# Patient Record
Sex: Male | Born: 1980 | Race: Black or African American | Hispanic: No | Marital: Single | Smoking: Current some day smoker
Health system: Southern US, Community
[De-identification: ages and names within clinical notes are randomized; demographics above are authoritative.]

## PROBLEM LIST (undated history)

## (undated) DIAGNOSIS — L729 Follicular cyst of the skin and subcutaneous tissue, unspecified: Secondary | ICD-10-CM

## (undated) HISTORY — PX: HAND TENDON SURGERY: SHX663

---
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
## 2000-02-23 ENCOUNTER — Emergency Department (HOSPITAL_COMMUNITY): Admission: EM | Admit: 2000-02-23 | Discharge: 2000-02-24 | Payer: Self-pay

## 2000-02-24 ENCOUNTER — Emergency Department (HOSPITAL_COMMUNITY): Admission: EM | Admit: 2000-02-24 | Discharge: 2000-02-24 | Payer: Self-pay

## 2000-02-26 ENCOUNTER — Emergency Department (HOSPITAL_COMMUNITY): Admission: EM | Admit: 2000-02-26 | Discharge: 2000-02-26 | Payer: Self-pay | Admitting: Emergency Medicine

## 2000-04-29 ENCOUNTER — Emergency Department (HOSPITAL_COMMUNITY): Admission: EM | Admit: 2000-04-29 | Discharge: 2000-04-29 | Payer: Self-pay | Admitting: Emergency Medicine

## 2000-05-16 ENCOUNTER — Emergency Department (HOSPITAL_COMMUNITY): Admission: EM | Admit: 2000-05-16 | Discharge: 2000-05-16 | Payer: Self-pay | Admitting: Emergency Medicine

## 2000-05-18 ENCOUNTER — Emergency Department (HOSPITAL_COMMUNITY): Admission: EM | Admit: 2000-05-18 | Discharge: 2000-05-18 | Payer: Self-pay | Admitting: Emergency Medicine

## 2000-07-14 ENCOUNTER — Emergency Department (HOSPITAL_COMMUNITY): Admission: EM | Admit: 2000-07-14 | Discharge: 2000-07-14 | Payer: Self-pay | Admitting: Emergency Medicine

## 2001-01-14 ENCOUNTER — Emergency Department (HOSPITAL_COMMUNITY): Admission: EM | Admit: 2001-01-14 | Discharge: 2001-01-14 | Payer: Self-pay | Admitting: Internal Medicine

## 2002-01-30 ENCOUNTER — Emergency Department (HOSPITAL_COMMUNITY): Admission: EM | Admit: 2002-01-30 | Discharge: 2002-01-30 | Payer: Self-pay | Admitting: Emergency Medicine

## 2002-04-15 ENCOUNTER — Encounter: Payer: Self-pay | Admitting: Emergency Medicine

## 2002-04-15 ENCOUNTER — Observation Stay (HOSPITAL_COMMUNITY): Admission: AC | Admit: 2002-04-15 | Discharge: 2002-04-16 | Payer: Self-pay

## 2002-11-21 ENCOUNTER — Emergency Department (HOSPITAL_COMMUNITY): Admission: EM | Admit: 2002-11-21 | Discharge: 2002-11-21 | Payer: Self-pay | Admitting: Emergency Medicine

## 2003-10-10 ENCOUNTER — Emergency Department (HOSPITAL_COMMUNITY): Admission: EM | Admit: 2003-10-10 | Discharge: 2003-10-10 | Payer: Self-pay | Admitting: Emergency Medicine

## 2003-12-16 ENCOUNTER — Inpatient Hospital Stay (HOSPITAL_COMMUNITY): Admission: AC | Admit: 2003-12-16 | Discharge: 2003-12-19 | Payer: Self-pay

## 2003-12-30 ENCOUNTER — Emergency Department (HOSPITAL_COMMUNITY): Admission: EM | Admit: 2003-12-30 | Discharge: 2003-12-30 | Payer: Self-pay | Admitting: Emergency Medicine

## 2004-01-10 ENCOUNTER — Encounter: Admission: RE | Admit: 2004-01-10 | Discharge: 2004-04-09 | Payer: Self-pay | Admitting: Orthopedic Surgery

## 2004-03-04 ENCOUNTER — Emergency Department (HOSPITAL_COMMUNITY): Admission: EM | Admit: 2004-03-04 | Discharge: 2004-03-04 | Payer: Self-pay | Admitting: Emergency Medicine

## 2004-03-10 ENCOUNTER — Emergency Department (HOSPITAL_COMMUNITY): Admission: EM | Admit: 2004-03-10 | Discharge: 2004-03-10 | Payer: Self-pay | Admitting: Emergency Medicine

## 2004-04-17 ENCOUNTER — Emergency Department (HOSPITAL_COMMUNITY): Admission: EM | Admit: 2004-04-17 | Discharge: 2004-04-17 | Payer: Self-pay | Admitting: *Deleted

## 2004-10-29 ENCOUNTER — Emergency Department (HOSPITAL_COMMUNITY): Admission: EM | Admit: 2004-10-29 | Discharge: 2004-10-29 | Payer: Self-pay

## 2005-01-25 ENCOUNTER — Emergency Department (HOSPITAL_COMMUNITY): Admission: EM | Admit: 2005-01-25 | Discharge: 2005-01-25 | Payer: Self-pay | Admitting: Emergency Medicine

## 2005-10-22 ENCOUNTER — Emergency Department (HOSPITAL_COMMUNITY): Admission: EM | Admit: 2005-10-22 | Discharge: 2005-10-22 | Payer: Self-pay | Admitting: Emergency Medicine

## 2005-12-26 ENCOUNTER — Emergency Department (HOSPITAL_COMMUNITY): Admission: EM | Admit: 2005-12-26 | Discharge: 2005-12-26 | Payer: Self-pay | Admitting: Emergency Medicine

## 2006-01-06 ENCOUNTER — Emergency Department (HOSPITAL_COMMUNITY): Admission: EM | Admit: 2006-01-06 | Discharge: 2006-01-06 | Payer: Self-pay | Admitting: Emergency Medicine

## 2006-06-03 ENCOUNTER — Emergency Department (HOSPITAL_COMMUNITY): Admission: EM | Admit: 2006-06-03 | Discharge: 2006-06-03 | Payer: Self-pay | Admitting: Emergency Medicine

## 2006-06-16 ENCOUNTER — Emergency Department (HOSPITAL_COMMUNITY): Admission: EM | Admit: 2006-06-16 | Discharge: 2006-06-16 | Payer: Self-pay | Admitting: Family Medicine

## 2006-06-24 ENCOUNTER — Emergency Department (HOSPITAL_COMMUNITY): Admission: EM | Admit: 2006-06-24 | Discharge: 2006-06-25 | Payer: Self-pay | Admitting: Emergency Medicine

## 2006-09-08 ENCOUNTER — Emergency Department (HOSPITAL_COMMUNITY): Admission: EM | Admit: 2006-09-08 | Discharge: 2006-09-08 | Payer: Self-pay | Admitting: Emergency Medicine

## 2007-01-17 ENCOUNTER — Emergency Department (HOSPITAL_COMMUNITY): Admission: EM | Admit: 2007-01-17 | Discharge: 2007-01-17 | Payer: Self-pay | Admitting: Family Medicine

## 2007-09-18 ENCOUNTER — Emergency Department (HOSPITAL_COMMUNITY): Admission: EM | Admit: 2007-09-18 | Discharge: 2007-09-18 | Payer: Self-pay | Admitting: Emergency Medicine

## 2007-09-19 ENCOUNTER — Emergency Department (HOSPITAL_COMMUNITY): Admission: EM | Admit: 2007-09-19 | Discharge: 2007-09-19 | Payer: Self-pay | Admitting: Emergency Medicine

## 2008-03-10 ENCOUNTER — Emergency Department (HOSPITAL_COMMUNITY): Admission: EM | Admit: 2008-03-10 | Discharge: 2008-03-10 | Payer: Self-pay | Admitting: Emergency Medicine

## 2008-03-13 ENCOUNTER — Emergency Department (HOSPITAL_COMMUNITY): Admission: EM | Admit: 2008-03-13 | Discharge: 2008-03-13 | Payer: Self-pay | Admitting: Emergency Medicine

## 2008-06-23 ENCOUNTER — Emergency Department (HOSPITAL_COMMUNITY): Admission: EM | Admit: 2008-06-23 | Discharge: 2008-06-23 | Payer: Self-pay | Admitting: Emergency Medicine

## 2009-02-27 ENCOUNTER — Emergency Department (HOSPITAL_COMMUNITY): Admission: EM | Admit: 2009-02-27 | Discharge: 2009-02-27 | Payer: Self-pay | Admitting: Emergency Medicine

## 2009-09-02 ENCOUNTER — Emergency Department (HOSPITAL_COMMUNITY): Admission: EM | Admit: 2009-09-02 | Discharge: 2009-09-02 | Payer: Self-pay | Admitting: Emergency Medicine

## 2010-01-27 ENCOUNTER — Emergency Department (HOSPITAL_COMMUNITY): Admission: EM | Admit: 2010-01-27 | Discharge: 2010-01-27 | Payer: Self-pay | Admitting: Family Medicine

## 2010-03-26 ENCOUNTER — Emergency Department (HOSPITAL_COMMUNITY): Admission: EM | Admit: 2010-03-26 | Discharge: 2010-03-26 | Payer: Self-pay | Admitting: Emergency Medicine

## 2010-04-22 ENCOUNTER — Emergency Department (HOSPITAL_COMMUNITY): Admission: EM | Admit: 2010-04-22 | Discharge: 2010-04-22 | Payer: Self-pay | Admitting: Family Medicine

## 2011-01-25 LAB — URINALYSIS, ROUTINE W REFLEX MICROSCOPIC
Bilirubin Urine: NEGATIVE
Hgb urine dipstick: NEGATIVE
Nitrite: NEGATIVE
Specific Gravity, Urine: 1.029 (ref 1.005–1.030)
pH: 7 (ref 5.0–8.0)

## 2011-01-25 LAB — URINE MICROSCOPIC-ADD ON

## 2011-01-29 LAB — CULTURE, ROUTINE-ABSCESS

## 2011-02-11 LAB — WOUND CULTURE

## 2011-02-17 LAB — GC/CHLAMYDIA PROBE AMP, GENITAL: Chlamydia, DNA Probe: NEGATIVE

## 2011-03-03 ENCOUNTER — Emergency Department (HOSPITAL_COMMUNITY)
Admission: EM | Admit: 2011-03-03 | Discharge: 2011-03-04 | Disposition: A | Discharge status: Home / Self Care | Payer: Self-pay | Attending: Emergency Medicine | Admitting: Emergency Medicine

## 2011-03-03 DIAGNOSIS — R300 Dysuria: Secondary | ICD-10-CM | POA: Insufficient documentation

## 2011-03-03 DIAGNOSIS — N342 Other urethritis: Secondary | ICD-10-CM | POA: Insufficient documentation

## 2011-03-03 LAB — URINE MICROSCOPIC-ADD ON

## 2011-03-03 LAB — URINALYSIS, ROUTINE W REFLEX MICROSCOPIC
Bilirubin Urine: NEGATIVE
Glucose, UA: NEGATIVE mg/dL
Hgb urine dipstick: NEGATIVE
Specific Gravity, Urine: 1.026 (ref 1.005–1.030)
Urobilinogen, UA: 1 mg/dL (ref 0.0–1.0)
pH: 6.5 (ref 5.0–8.0)

## 2011-03-04 LAB — GC/CHLAMYDIA PROBE AMP, GENITAL
Chlamydia, DNA Probe: NEGATIVE
GC Probe Amp, Genital: NEGATIVE
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
## 2011-03-26 NOTE — Op Note (Signed)
[place]. [facility]  Patient:    Perry Reilly, Perry Reilly Visit Number: [id number] MRN: [id number]          Service Type: TRA Location: 5000 5029 01 Attending Physician:  Marlowe Shores Dictated by:   Artist Pais Mina Marble, M.D. Proc. Date: 04/15/02 Admit Date:  04/15/2002 Discharge Date: 04/16/2002                             Operative Report  PREOPERATIVE DIAGNOSIS:  Bilateral hand injuries with fractures, tendon injuries, degloving skin loss.  POSTOPERATIVE DIAGNOSIS:  Bilateral hand injuries with fractures, tendon injuries, degloving skin loss.  PROCEDURE: 1. Right thumb revision amputation with V-Y flap. 2. Right index finger proximal phalanx fracture irrigation, debridement and    fixation; collateral ligament repair and extensor tendon repair. 3. Right long finger extensor tendon repair. 4. Right ring finger extensor tendon repair. 5. Left long finger extensor tendon repair. 6. Left ring and left small finger laceration closures.  SURGEON:  Artist Pais. Mina Marble, M.D.  ASSISTANT:  None.  ANESTHESIA:  General.  TOURNIQUET TIME:  An hour and 15 minutes on the right side, 20 minutes on the left side.  DESCRIPTION OF PROCEDURE:  The patient was taken to the operating room and after the induction of adequate general anesthesia, the right and left upper extremities were prepped and draped in the usual sterile fashion.  The right side was irrigated using a pulse lavage and 3 L of normal saline were used to irrigate open wounds over the thumb, index, long and ring fingers on this hand.  Once this was done, an Esmarch was used to exsanguinate the limb and the tourniquet was inflated to 250 mmHg.  At this point in time, the right thumb was examined.  There was complete loss of the nail matrix with exposed distal phalangeal bone, loss of the nail plate and a complex stellate laceration of the distal tip.  The thumb was debrided of nonviable tissue.   A V-Y advancement flap was marked on the skin and advanced distally for 1 cm.  A small portion of the distal phalangeal bone was removed with a rongeur and a V-Y advancement flap was then used to close this wound; this was done with 4-0 nylon.  After this was done, attention was then paid to the index finger. Dorsally, there was a longitudinal stellate laceration from the MCP to the middle of the middle phalanx with significant damage to the extensor mechanism, as well as an open fracture of the proximal phalanx distally and collateral ligament tear.  This was again thoroughly irrigated and debrided of nonviable tissue.  Once this was done, the extensor tendon was repaired using 4-0 Mersilene as best as could be achieved, including repair of the collateral ligament.  Once this was done, the PIP joint was extended and an 0.45 K-wire was driven from distal-to-proximal across the PIP joint to take tension off the extensor tendon repair.  The extensor tendon repair was a complex repair including both tears longitudinally and transversely.  The tear went from the MCP to the PIP joint.  After this was done, the skin was closed using 4-0 nylon; a very tight closure was obtained and there was some dorsal skin. loss.  The same procedure was then performed on the long finger, except the PIP joint was not pinned.  The extensor tendon was repaired using the 4-0 Mersilene, the skin with a 4-0 nylon.  The ring finger had an extensor tendon ______ that was repaired with Mersilene and again, nylon was used on the skin.  After this was done, this was placed in a sterile dressing of Xeroform, 4 x 4s and a volar splint with the PIP joints and the MP joints in extension and a Coban wrap on the thumb.  After this was done, the second procedure was performed on the left hand where the long finger extensor tendon was repaired after the joint was irrigated.  The extensor tendon was repaired with 4-0 Mersilene and  the skin with 4-0 nylon, and two complex lacerations over the ring and small fingers were repaired with 4-0 nylon after a thorough irrigation and debridement.  The patient was then placed in a sterile dressing of Xeroform, 4 x 4s, the volar extension splint for the long finger to protect the extensor tendon repair and compressive dressing for the rest of the hand.  The patient tolerated the procedure well and went to recovery room in stable fashion. Dictated by:   Artist Pais Mina Marble, M.D. Attending Physician:  Marlowe Shores DD:  04/15/02 TD:  04/17/02 Job: [id number]

## 2011-08-04 LAB — BASIC METABOLIC PANEL
BUN: 11
CO2: 25
Calcium: 8.6
Creatinine, Ser: 0.76
GFR calc non Af Amer: >60
Glucose, Bld: 110 — ABNORMAL HIGH

## 2011-08-04 LAB — DIFFERENTIAL
Basophils Absolute: 0
Basophils Relative: 0
Eosinophils Relative: 1
Lymphocytes Relative: 28
Neutro Abs: 4.7

## 2011-08-04 LAB — CBC
MCHC: 33.7
Platelets: 129 — ABNORMAL LOW
RDW: 14.2

## 2011-08-10 ENCOUNTER — Inpatient Hospital Stay (INDEPENDENT_AMBULATORY_CARE_PROVIDER_SITE_OTHER)
Admission: RE | Admit: 2011-08-10 | Discharge: 2011-08-10 | Disposition: A | Discharge status: Home / Self Care | Payer: Self-pay | Source: Ambulatory Visit | Attending: Family Medicine | Admitting: Family Medicine

## 2011-08-10 DIAGNOSIS — L723 Sebaceous cyst: Secondary | ICD-10-CM

## 2011-09-17 ENCOUNTER — Emergency Department (INDEPENDENT_AMBULATORY_CARE_PROVIDER_SITE_OTHER)
Admission: EM | Admit: 2011-09-17 | Discharge: 2011-09-17 | Disposition: A | Discharge status: Home Health | Payer: Self-pay | Source: Home / Self Care | Attending: Family Medicine | Admitting: Family Medicine

## 2011-09-17 DIAGNOSIS — L723 Sebaceous cyst: Secondary | ICD-10-CM

## 2011-09-17 MED ORDER — SULFAMETHOXAZOLE-TRIMETHOPRIM 800-160 MG PO TABS: 1.0000 | ORAL_TABLET | Freq: Two times a day (BID) | ORAL | Status: AC

## 2011-09-17 MED ORDER — HYDROCODONE-ACETAMINOPHEN 5-325 MG PO TABS: ORAL_TABLET | ORAL | Status: AC

## 2011-09-17 NOTE — ED Notes (Signed)
Applied sterile dressing post I&D behind right ear

## 2011-09-17 NOTE — ED Notes (Signed)
C/o swollen painful area on his right ear, present for 3-4 days; no improvement w hot compresses

## 2011-09-17 NOTE — ED Provider Notes (Signed)
History     CSN: [id number] Arrival date & time: 09/17/2011 11:23 AM   First MD Initiated Contact with Patient 09/17/11 1158      Chief Complaint  Patient presents with  . Cyst    (Consider location/radiation/quality/duration/timing/severity/associated sxs/prior treatment) Patient is a 30 y.o. male presenting with abscess. The history is provided by the patient.  Abscess  This is a new problem. The current episode started less than one week ago. The onset was sudden. The problem has been gradually worsening. The abscess is present on the right ear. The problem is moderate. The abscess is characterized by redness, painfulness and swelling. It is unknown what he was exposed to. Pertinent negatives include no fever and no sore throat. There were no sick contacts.    History reviewed. No pertinent past medical history.  History reviewed. No pertinent past surgical history.  History reviewed. No pertinent family history.  History  Substance Use Topics  . Smoking status: Never Smoker   . Smokeless tobacco: Not on file  . Alcohol Use: Yes     occasional       Review of Systems  Constitutional: Negative.  Negative for fever.  HENT: Positive for ear pain. Negative for sore throat, trouble swallowing and ear discharge.   Eyes: Negative.   Respiratory: Negative.   Cardiovascular: Negative.   Gastrointestinal: Negative.   Musculoskeletal: Negative.     Allergies  Review of patient's allergies indicates not on file.  Home Medications  No current outpatient prescriptions on file.  BP 138/88  Pulse 78  Temp(Src) 97.8 F (36.6 C) (Tympanic)  Resp 16  SpO2 100%  Physical Exam  Constitutional: He is oriented to person, place, and time. He appears well-developed and well-nourished.  HENT:  Head: Normocephalic and atraumatic.  Right Ear: Tympanic membrane normal. There is swelling and tenderness.  Left Ear: Tympanic membrane normal.  Ears:  Eyes: EOM are normal.  Neck:  Normal range of motion. Neck supple.  Lymphadenopathy:    He has cervical adenopathy.  Neurological: He is alert and oriented to person, place, and time.  Psychiatric: His behavior is normal.    ED Course  INCISION AND DRAINAGE Date/Time: 09/17/2011 1:09 PM Performed by: Richardo Priest Authorized by: Richardo Priest Consent: Verbal consent obtained. Risks and benefits: risks, benefits and alternatives were discussed Consent given by: patient Type: abscess Body area: head/neck Location details: right external ear Anesthesia: local infiltration Local anesthetic: lidocaine 2% without epinephrine Anesthetic total: 3 ml Patient sedated: no Scalpel size: 11 Incision type: single straight Complexity: simple Drainage: purulent Drainage amount: copious Wound treatment: wound left open   (including critical care time)  Labs Reviewed - No data to display No results found.   No diagnosis found.    MDM          Richardo Priest, MD 09/17/11 2141

## 2012-10-13 ENCOUNTER — Encounter (HOSPITAL_COMMUNITY): Payer: Self-pay

## 2012-10-13 ENCOUNTER — Emergency Department (INDEPENDENT_AMBULATORY_CARE_PROVIDER_SITE_OTHER)
Admission: EM | Admit: 2012-10-13 | Discharge: 2012-10-13 | Disposition: A | Discharge status: Home / Self Care | Payer: Self-pay | Source: Home / Self Care | Attending: Family Medicine | Admitting: Family Medicine

## 2012-10-13 DIAGNOSIS — L723 Sebaceous cyst: Secondary | ICD-10-CM

## 2012-10-13 DIAGNOSIS — L089 Local infection of the skin and subcutaneous tissue, unspecified: Secondary | ICD-10-CM

## 2012-10-13 NOTE — ED Provider Notes (Signed)
History     CSN: [id number]  Arrival date & time 10/13/12  1053   First MD Initiated Contact with Patient 10/13/12 1102      Chief Complaint  Patient presents with  . Skin Problem    (Consider location/radiation/quality/duration/timing/severity/associated sxs/prior treatment) Patient is a 31 y.o. male presenting with abscess. The history is provided by the patient.  Abscess  This is a new problem. The current episode started less than one week ago. The problem has been gradually worsening. The abscess is present on the face. The problem is mild. The abscess is characterized by redness, painfulness and swelling.    History reviewed. No pertinent past medical history.  History reviewed. No pertinent past surgical history.  History reviewed. No pertinent family history.  History  Substance Use Topics  . Smoking status: Never Smoker   . Smokeless tobacco: Not on file  . Alcohol Use: Yes     Comment: occasional       Review of Systems  Constitutional: Negative.   Skin: Positive for rash.    Allergies  Review of patient's allergies indicates no known allergies.  Home Medications  No current outpatient prescriptions on file.  BP 122/69  Pulse 66  Temp 97.9 F (36.6 C) (Oral)  Resp 10  SpO2 100%  Physical Exam  Nursing note and vitals reviewed. Constitutional: He is oriented to person, place, and time. He appears well-developed and well-nourished.  Neck: Normal range of motion. Neck supple.  Lymphadenopathy:    He has no cervical adenopathy.  Neurological: He is alert and oriented to person, place, and time.  Skin: Skin is warm and dry. Rash noted.       Tender cystic erythematous sts to right cheek.    ED Course  INCISION AND DRAINAGE Date/Time: 10/13/2012 12:09 PM Performed by: Linna Hoff Authorized by: Bradd Canary D Consent: Verbal consent obtained. Consent given by: patient Patient understanding: patient states understanding of the procedure  being performed Type: abscess Body area: head/neck Location details: face Local anesthetic: topical anesthetic Patient sedated: no Scalpel size: 11 Incision type: single straight Complexity: simple Drainage: purulent Drainage amount: moderate Packing material: none Patient tolerance: Patient tolerated the procedure well with no immediate complications. Comments: Culture obtained   (including critical care time)  Labs Reviewed - No data to display No results found.   1. Infected sebaceous cyst       MDM          Linna Hoff, MD 10/13/12 1213

## 2012-10-13 NOTE — ED Notes (Signed)
Noted swollen painful area on right cheek for past 2 days, getting much worse since yesterday

## 2012-10-16 LAB — CULTURE, ROUTINE-ABSCESS

## 2012-10-24 NOTE — ED Notes (Signed)
Abscess culture R face: Mod. Staph species (coagulase neg.).  Pt. had I and D.  12/10 Message sent to Dr. Artis Flock and he said no other treatment needed. Perry Reilly 10/24/2012

## 2012-11-22 ENCOUNTER — Encounter (HOSPITAL_COMMUNITY): Payer: Self-pay | Admitting: Emergency Medicine

## 2012-11-22 ENCOUNTER — Emergency Department (HOSPITAL_COMMUNITY)
Admission: EM | Admit: 2012-11-22 | Discharge: 2012-11-22 | Disposition: A | Discharge status: Home / Self Care | Payer: Self-pay | Attending: Emergency Medicine | Admitting: Emergency Medicine

## 2012-11-22 DIAGNOSIS — L02411 Cutaneous abscess of right axilla: Secondary | ICD-10-CM

## 2012-11-22 DIAGNOSIS — IMO0002 Reserved for concepts with insufficient information to code with codable children: Secondary | ICD-10-CM | POA: Insufficient documentation

## 2012-11-22 DIAGNOSIS — F17200 Nicotine dependence, unspecified, uncomplicated: Secondary | ICD-10-CM | POA: Insufficient documentation

## 2012-11-22 HISTORY — DX: Follicular cyst of the skin and subcutaneous tissue, unspecified: L72.9

## 2012-11-22 MED ORDER — SULFAMETHOXAZOLE-TRIMETHOPRIM 800-160 MG PO TABS: 1.0000 | ORAL_TABLET | Freq: Two times a day (BID) | ORAL | Status: DC

## 2012-11-22 MED ORDER — HYDROCODONE-ACETAMINOPHEN 5-325 MG PO TABS: 1.0000 | ORAL_TABLET | ORAL | Status: DC | PRN

## 2012-11-22 MED ORDER — CEPHALEXIN 500 MG PO CAPS: 1000.0000 mg | ORAL_CAPSULE | Freq: Four times a day (QID) | ORAL | Status: DC

## 2012-11-22 MED ORDER — LIDOCAINE HCL 2 % IJ SOLN
INTRAMUSCULAR | Status: AC
  Administered 2012-11-22: 400 mg
  Filled 2012-11-22: qty 20

## 2012-11-22 MED ORDER — HYDROCODONE-ACETAMINOPHEN 5-325 MG PO TABS
1.0000 | ORAL_TABLET | Freq: Once | ORAL | Status: AC
  Administered 2012-11-22: 1 via ORAL
  Filled 2012-11-22: qty 1

## 2012-11-22 NOTE — ED Notes (Signed)
I&D tray set up at bedside with suture cart at door.  Pt in gown, waiting on provider.

## 2012-11-22 NOTE — ED Notes (Signed)
Pt states he feels like the numbing medication has helped and is waiting on his procedure at this time.

## 2012-11-22 NOTE — ED Provider Notes (Signed)
Medical screening examination/treatment/procedure(s) were performed by non-physician practitioner and as supervising physician I was immediately available for consultation/collaboration.   Gerhard Munch, MD 11/22/12 [phone]

## 2012-11-22 NOTE — ED Notes (Signed)
Pt c/o large cyst in the rt underarm that developed 3 days ago. Pt states he has had a cyst drained here twice in the past but that this time is it much larger than before.

## 2012-11-22 NOTE — ED Provider Notes (Signed)
History     CSN: [id number]  Arrival date & time 11/22/12  3086   First MD Initiated Contact with Patient 11/22/12 2019      Chief Complaint  Patient presents with  . Cyst    (Consider location/radiation/quality/duration/timing/severity/associated sxs/prior treatment) HPI History provided by pt.   Pt presents w/ abscess of right axilla x 3 days.  Intensity of pain has gradually increased.  No drainage.  No associated fever.  This is the third abscess he has had in same location; most recently 1 year ago.   Past Medical History  Diagnosis Date  . Generalized skin cysts     History reviewed. No pertinent past surgical history.  No family history on file.  History  Substance Use Topics  . Smoking status: Current Some Day Smoker  . Smokeless tobacco: Not on file  . Alcohol Use: Yes     Comment: occasional       Review of Systems  All other systems reviewed and are negative.    Allergies  Review of patient's allergies indicates no known allergies.  Home Medications   Current Outpatient Rx  Name  Route  Sig  Dispense  Refill  . IBUPROFEN 200 MG PO TABS   Oral   Take 600 mg by mouth every 6 (six) hours as needed.         . CEPHALEXIN 500 MG PO CAPS   Oral   Take 2 capsules (1,000 mg total) by mouth 4 (four) times daily.   28 capsule   0   . HYDROCODONE-ACETAMINOPHEN 5-325 MG PO TABS   Oral   Take 1 tablet by mouth every 4 (four) hours as needed for pain.   20 tablet   0   . SULFAMETHOXAZOLE-TRIMETHOPRIM 800-160 MG PO TABS   Oral   Take 1 tablet by mouth 2 (two) times daily.   14 tablet   0     BP 124/65  Pulse 71  Temp 98.8 F (37.1 C) (Oral)  Resp 19  SpO2 100%  Physical Exam  Nursing note and vitals reviewed. Constitutional: He is oriented to person, place, and time. He appears well-developed and well-nourished. No distress.  HENT:  Head: Normocephalic and atraumatic.  Eyes:       Normal appearance  Neck: Normal range of motion.    Pulmonary/Chest: Effort normal.  Musculoskeletal: Normal range of motion.  Neurological: He is alert and oriented to person, place, and time.  Skin:       5-6cm abscess in right axilla.  Some portions fluctuant and others indurated.  Narrow ring of surrounding erythema.  Ttp.  Visualized w/ bedside US and large, diffuse collection of underlying fluid.    Psychiatric: He has a normal mood and affect. His behavior is normal.    ED Course  Procedures (including critical care time)  INCISION AND DRAINAGE Performed by: Ruby Cola E Consent: Verbal consent obtained. Risks and benefits: risks, benefits and alternatives were discussed Type: abscess  Body area: R axilla  Anesthesia: local infiltration  Incision was made with a scalpel.  Local anesthetic: lidocaine 2% w/out epinephrine  Anesthetic total: 6 ml  Complexity: complex Blunt dissection to break up loculations  Drainage: purulent  Drainage amount: large  Packing material: none  Patient tolerance: Patient tolerated the procedure well with no immediate complications.    Labs Reviewed - No data to display No results found.   1. Abscess of right axilla       MDM  Pt  presents w/ abscess of right axilla.  I&D performed and large amount of pus drained.  Pt received 1 vicodin for pain in ED and d/c'd home w/ same + abx (because abscess so large and small amt of surrounding cellulitis).  Return precautions discussed.  10:37 PM         Otilio Miu, PA-C 11/22/12 2237

## 2012-11-22 NOTE — ED Notes (Signed)
Ultrasound at bedside

## 2013-01-04 ENCOUNTER — Emergency Department (HOSPITAL_COMMUNITY)
Admission: EM | Admit: 2013-01-04 | Discharge: 2013-01-05 | Disposition: A | Discharge status: Home / Self Care | Payer: Self-pay | Attending: Emergency Medicine | Admitting: Emergency Medicine

## 2013-01-04 ENCOUNTER — Encounter (HOSPITAL_COMMUNITY): Payer: Self-pay | Admitting: *Deleted

## 2013-01-04 DIAGNOSIS — R350 Frequency of micturition: Secondary | ICD-10-CM | POA: Insufficient documentation

## 2013-01-04 DIAGNOSIS — Z872 Personal history of diseases of the skin and subcutaneous tissue: Secondary | ICD-10-CM | POA: Insufficient documentation

## 2013-01-04 DIAGNOSIS — F17200 Nicotine dependence, unspecified, uncomplicated: Secondary | ICD-10-CM | POA: Insufficient documentation

## 2013-01-04 DIAGNOSIS — L0291 Cutaneous abscess, unspecified: Secondary | ICD-10-CM

## 2013-01-04 DIAGNOSIS — IMO0002 Reserved for concepts with insufficient information to code with codable children: Secondary | ICD-10-CM | POA: Insufficient documentation

## 2013-01-04 DIAGNOSIS — Z202 Contact with and (suspected) exposure to infections with a predominantly sexual mode of transmission: Secondary | ICD-10-CM | POA: Insufficient documentation

## 2013-01-04 MED ORDER — METRONIDAZOLE 500 MG PO TABS
500.0000 mg | ORAL_TABLET | Freq: Once | ORAL | Status: AC
  Administered 2013-01-04: 500 mg via ORAL
  Filled 2013-01-04: qty 1

## 2013-01-04 MED ORDER — METRONIDAZOLE 500 MG PO TABS: 500.0000 mg | ORAL_TABLET | Freq: Two times a day (BID) | ORAL | Status: DC

## 2013-01-04 MED ORDER — CEFTRIAXONE SODIUM 250 MG IJ SOLR
250.0000 mg | Freq: Once | INTRAMUSCULAR | Status: AC
  Administered 2013-01-04: 250 mg via INTRAMUSCULAR
  Filled 2013-01-04: qty 250

## 2013-01-04 MED ORDER — DOXYCYCLINE HYCLATE 100 MG PO CAPS: 100.0000 mg | ORAL_CAPSULE | Freq: Two times a day (BID) | ORAL | Status: DC

## 2013-01-04 MED ORDER — DOXYCYCLINE HYCLATE 100 MG PO TABS
100.0000 mg | ORAL_TABLET | Freq: Once | ORAL | Status: AC
  Administered 2013-01-04: 100 mg via ORAL
  Filled 2013-01-04: qty 1

## 2013-01-04 NOTE — ED Notes (Signed)
Burning sensation in penis; partner diagnosed with trichomonas; previous history of abscess right axilla; has flaired up a little bit

## 2013-01-04 NOTE — ED Provider Notes (Signed)
Medical screening examination/treatment/procedure(s) were performed by non-physician practitioner and as supervising physician I was immediately available for consultation/collaboration. Devoria Albe, MD, Armando Gang   Ward Givens, MD 01/04/13 [phone]
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
## 2013-01-05 LAB — GC/CHLAMYDIA PROBE AMP
CT Probe RNA: NEGATIVE
GC Probe RNA: NEGATIVE

## 2013-02-12 ENCOUNTER — Encounter (HOSPITAL_COMMUNITY): Payer: Self-pay | Admitting: Emergency Medicine

## 2013-02-12 ENCOUNTER — Emergency Department (HOSPITAL_COMMUNITY)
Admission: EM | Admit: 2013-02-12 | Discharge: 2013-02-12 | Disposition: A | Discharge status: Home / Self Care | Payer: Self-pay | Attending: Emergency Medicine | Admitting: Emergency Medicine

## 2013-02-12 DIAGNOSIS — R6883 Chills (without fever): Secondary | ICD-10-CM | POA: Insufficient documentation

## 2013-02-12 DIAGNOSIS — R1110 Vomiting, unspecified: Secondary | ICD-10-CM | POA: Insufficient documentation

## 2013-02-12 DIAGNOSIS — Z872 Personal history of diseases of the skin and subcutaneous tissue: Secondary | ICD-10-CM | POA: Insufficient documentation

## 2013-02-12 DIAGNOSIS — F17200 Nicotine dependence, unspecified, uncomplicated: Secondary | ICD-10-CM | POA: Insufficient documentation

## 2013-02-12 DIAGNOSIS — R109 Unspecified abdominal pain: Secondary | ICD-10-CM

## 2013-02-12 DIAGNOSIS — R197 Diarrhea, unspecified: Secondary | ICD-10-CM | POA: Insufficient documentation

## 2013-02-12 LAB — COMPREHENSIVE METABOLIC PANEL
ALT: 9 U/L (ref 0–53)
Alkaline Phosphatase: 48 U/L (ref 39–117)
CO2: 26 mEq/L (ref 19–32)
Chloride: 104 mEq/L (ref 96–112)
GFR calc Af Amer: >90 mL/min (ref >90)
Glucose, Bld: 90 mg/dL (ref 70–99)
Potassium: 4.3 mEq/L (ref 3.5–5.1)
Sodium: 141 mEq/L (ref 135–145)
Total Bilirubin: 0.4 mg/dL (ref 0.3–1.2)
Total Protein: 8 g/dL (ref 6.0–8.3)

## 2013-02-12 LAB — URINALYSIS, MICROSCOPIC ONLY
Bilirubin Urine: NEGATIVE
Ketones, ur: NEGATIVE mg/dL
Nitrite: NEGATIVE
Protein, ur: NEGATIVE mg/dL
pH: 6 (ref 5.0–8.0)

## 2013-02-12 LAB — CBC WITH DIFFERENTIAL/PLATELET
Eosinophils Absolute: 0.1 10*3/uL (ref 0.0–0.7)
Hemoglobin: 13.5 g/dL (ref 13.0–17.0)
Lymphocytes Relative: 28 % (ref 12–46)
Lymphs Abs: 1.5 10*3/uL (ref 0.7–4.0)
Monocytes Relative: 11 % (ref 3–12)
Neutro Abs: 3 10*3/uL (ref 1.7–7.7)
Neutrophils Relative %: 58 % (ref 43–77)
Platelets: 127 10*3/uL — ABNORMAL LOW (ref 150–400)
RBC: 4.04 MIL/uL — ABNORMAL LOW (ref 4.22–5.81)
WBC: 5.2 10*3/uL (ref 4.0–10.5)

## 2013-02-12 MED ORDER — ONDANSETRON 4 MG PO TBDP: 4.0000 mg | ORAL_TABLET | Freq: Three times a day (TID) | ORAL | Status: DC | PRN

## 2013-02-12 MED ORDER — SODIUM CHLORIDE 0.9 % IV BOLUS (SEPSIS)
1000.0000 mL | Freq: Once | INTRAVENOUS | Status: AC
  Administered 2013-02-12: 1000 mL via INTRAVENOUS

## 2013-02-12 NOTE — Discharge Instructions (Signed)
To help control diarrhea and vomiting symptoms, avoid dairy products, meats, and fatty foods until symptoms are better.  Foods like bananas, rice, applesauce, toast, and crackers are soothing to the stomach until you feel better. Hydrate with water or gatorade. Avoid fruit juices like apple, grape, or prune juice which can make diarrhea worse.  Establish relationship with primary care doctor (see resource guide below)  Affordable Health Care Below is information to help you research and register for [facility] that was started on August 08, 2012. Keep in mind when comparing different insurance plan prices that you may qualify for for financial assistance to lower your costs  The same application will let you find out if you and your family members might qualify for free or low-cost coverage available through Medicaid or the [employer] Program (CHIP).  www.http://www.long-jenkins.com/: Where individuals can learn about the Marketplace and the upcoming benefits (including where they can find local assistance), or be connected to appropriate resources in states that are running their own Marketplace.  Health Insurance Marketplace Call Center: If you have questions, call [phone]. TTY users should call [phone], Open 24 hours a day, 7 days a week.   ePompanoBeach.com.br: Where to get the latest resources to help spread the word about Affordable Health Care & the Marketplace. This website also has Health visitor in many different languages.    More information:   Health insurance companies selling plans through the Marketplace cant deny you coverage or charge you more due to pre-existing health conditions, and they cant charge women and men different premiums based on their gender.  The information is all available online, but you can apply 4 ways: online, by phone, by mail, or in-person with the help of a trained assister or navigator.   Each health plan will  generally offer comprehensive coverage, including a core set of essential health benefits like doctor visits, preventive care, maternity care, hospitalization, prescription drugs, and more.   No matter where you live, there will be a Marketplace in your state, offering plans from private companies where youll be able to compare your health coverage options based on price, benefits, quality, and other features important to you before you make a choice.    RESOURCE GUIDE  Insufficient Money for Medicine: Contact United Way:  call "211" or [facility] 3175980857.  No Primary Care Doctor: - Call Health Connect  [phone] - can help you locate a primary care doctor that  accepts your insurance, provides certain services, etc. - Physician Referral [phone]  Agencies that provide inexpensive medical care: - [facility]  [phone] - [facility]  [phone] - [facility]  [phone] [facility]  [phone] - [facility]  [phone] [facility]  [phone]  Medicaid-accepting [facility] Providers: - [facility]- [address]  [phone], Mon-Fri 9am-7pm, Sat 9am-1pm - [facility]- [address] [place], [address] [place]  [phone] - [facility]- [address] [place]  [phone] [facility]- [address]  [phone] - Renaye Rakers- [address] [place], [address], [phone]  Only accepts [place] patients after they have their name  applied to their card  Self Pay (no insurance) in [place]: - Sickle Cell Patients: Dr Willey Blade, [facility]  [address] [place], [phone] - [facility]- [address]  [phone]       [facility] [place]- [address]       -     [facility]- see information above (Speak to [employer] if you do not have insurance)       -  [facility]- [address] [place], [phone]       -  [facility]- [address],  [phone]       -  [facility]- [address] [phone]       -  Dr Julio Sicks-  [address], [place], [phone]       -  [facility]- [address] [phone]       -  [facility]- [address], [phone], also [address], [phone]       -    [facility]- [address] [place], [phone], 1st & 3rd Saturday   every month, 10am-1pm  1) Find a Doctor and Pay Out of Pocket Although you won't have to find out who is covered by your insurance plan, it is a good idea to ask around and get recommendations. You will then need to call the office and see if the doctor you have chosen will accept you as a new patient and what types of options they offer for patients who are self-pay. Some doctors offer discounts or will set up payment plans for their patients who do not have insurance, but you will need to ask so you aren't surprised when you get to your appointment.  2) Contact Your Local Health Department Not all health departments have doctors that can see patients for sick visits, but many do, so it is worth a call to see if yours does. If you don't know where your local health department is, you can check in your phone book. The CDC also has a tool to help you locate your state's health department, and many state websites also have listings of all of their local health departments.  3) Find a Walk-in Clinic If your illness is not likely to be very severe or complicated, you may want to try a walk in clinic. These are popping up all over the country in pharmacies, drugstores, and shopping centers. They're usually staffed by nurse practitioners or physician assistants that have been trained to treat common illnesses and complaints. They're usually fairly quick and inexpensive. However, if  you have serious medical issues or chronic medical problems, these are probably not your best option

## 2013-02-12 NOTE — ED Notes (Signed)
Bed:WA10<BR> Expected date:<BR> Expected time:<BR> Means of arrival:<BR> Comments:<BR> EMS

## 2013-02-12 NOTE — ED Provider Notes (Signed)
Medical screening examination/treatment/procedure(s) were performed by non-physician practitioner and as supervising physician I was immediately available for consultation/collaboration.  Lyanne Co, MD 02/12/13 [phone]

## 2013-02-12 NOTE — ED Notes (Signed)
Pt c/o abd pain with nausea and vomiting that began at 1245 today. Pt reports this has happened in Jan 2014 but resolved.  Denies fever.  Reports chills.

## 2013-02-12 NOTE — ED Provider Notes (Signed)
History     CSN: [id number]  Arrival date & time 02/12/13  1335   None     Chief Complaint  Patient presents with  . Abdominal Pain    (Consider location/radiation/quality/duration/timing/severity/associated sxs/prior treatment) HPI Comments: 32 year old male presents complaining of abdominal pain and vomiting x1. Pt states he woke up, had some loose stools and mild abdominal pain. About 12:30 he was dropping his son off at day care, pain increased, described as intermittent, stabbing, 10/10 and then diminishes to a dull ache 7/10. Pt states at this point he vomited and laid down in the back of his pick up truck when day care staff called the ambulance.   Pt states felt similar pain/vomiting in January that resolved on its own.  Admits chills.  Pt denies any fever, hematuria, dysuria, shortness of breath, chest pain. Pt denies worse with food (hasn't eaten today). Denies sick contact (states a month ago his wife had stomach virus).  Patient is a 32 y.o. male presenting with abdominal pain.  Abdominal Pain Associated symptoms: chills, diarrhea and vomiting   Associated symptoms: no chest pain, no constipation, no dysuria, no fever, no nausea and no shortness of breath     Past Medical History  Diagnosis Date  . Generalized skin cysts     History reviewed. No pertinent past surgical history.  History reviewed. No pertinent family history.  History  Substance Use Topics  . Smoking status: Current Some Day Smoker  . Smokeless tobacco: Not on file  . Alcohol Use: Yes     Comment: occasional       Review of Systems  Constitutional: Positive for chills. Negative for fever, diaphoresis and appetite change.  HENT: Negative for neck pain and neck stiffness.   Respiratory: Negative for chest tightness and shortness of breath.   Cardiovascular: Negative for chest pain and palpitations.  Gastrointestinal: Positive for vomiting, abdominal pain and diarrhea. Negative for nausea  and constipation.       Pain is central abdominal, two episodes of vomiting (once prior, once here) no blood, no mucous. One episode of loose stool   Genitourinary: Negative for dysuria.  Musculoskeletal: Negative for gait problem.  Neurological: Positive for weakness. Negative for dizziness, light-headedness, numbness and headaches.       Generalized weakness    Allergies  Review of patient's allergies indicates no known allergies.  Home Medications  No current outpatient prescriptions on file.  BP 141/83  Pulse 76  Temp(Src) 97.7 F (36.5 C) (Oral)  Resp 17  Ht 6' (1.829 m)  Wt 150 lb (68.04 kg)  BMI 20.34 kg/m2  SpO2 100%  Physical Exam  Nursing note and vitals reviewed. Constitutional: He is oriented to person, place, and time. He appears well-developed and well-nourished. No distress.  HENT:  Head: Normocephalic and atraumatic.  Eyes: EOM are normal. Pupils are equal, round, and reactive to light.  Neck: Normal range of motion. Neck supple.  No meningeal signs  Cardiovascular: Normal rate, regular rhythm and normal heart sounds.  Exam reveals no gallop and no friction rub.   No murmur heard. Pulmonary/Chest: Effort normal and breath sounds normal. No respiratory distress. He has no wheezes. He has no rales. He exhibits no tenderness.  Abdominal: Soft. Bowel sounds are normal. He exhibits no distension. There is no tenderness. There is no rebound and no guarding.  No tenderness on palpation. Abdominal exam unimpressive.  Musculoskeletal: Normal range of motion. He exhibits no edema and no tenderness.  5/5  strength throughout. Good grip strength  Neurological: He is alert and oriented to person, place, and time. No cranial nerve deficit.  No focal deficits. Normal gait.  Skin: Skin is warm and dry. He is not diaphoretic. No erythema.  Psychiatric: He has a normal mood and affect.    ED Course  Procedures (including critical care time)  Labs Reviewed  CBC WITH  DIFFERENTIAL - Abnormal; Notable for the following:    RBC 4.04 (*)    HCT 38.9 (*)    Platelets 127 (*)    All other components within normal limits  URINALYSIS, MICROSCOPIC ONLY - Abnormal; Notable for the following:    Leukocytes, UA MODERATE (*)    Bacteria, UA FEW (*)    All other components within normal limits  URINE CULTURE  COMPREHENSIVE METABOLIC PANEL   No results found.   Date: 02/12/2013  Rate: 47  Rhythm: sinus rhythm  QRS Axis: normal  Intervals: normal  ST/T Wave abnormalities: normal  Conduction Disutrbances: none  Narrative Interpretation: Normal EKG  Old EKG Reviewed: None available   1. Abdominal pain   2. Emesis    New Prescriptions   ONDANSETRON (ZOFRAN ODT) 4 MG DISINTEGRATING TABLET    Take 1 tablet (4 mg total) by mouth every 8 (eight) hours as needed for nausea.      MDM  32 year old male presents complaining of abdominal pain and vomiting x1. Abdominal exam unimpressive. Pt states he is not nauseous. Pain is 6/10. Patient is nontoxic, nonseptic appearing, in no apparent distress.  Patient does not meet the SIRS or Sepsis criteria.  No concern for surgical abdomen, no peritoneal signs.  No indication of appendicitis, bowel obstruction, bowel perforation, cholecystitis, diverticulitis.    Discussed with Dr. Patria Mane. Will get basic labs/urinalysis. Fluids. Re-evaluate.   Pt is feeling better after 1L of fluid. Labs are unremarkable. Discussed importance of establishing a relationship with primary care doctor to follow up on an outpatient basis. Provided resource list and information on affordable care act since pt does not have insurance.   At this time there does not appear to be any evidence of an acute emergency medical condition and the patient appears stable for discharge with appropriate outpatient follow up.Diagnosis was discussed with patient who verbalizes understanding and is agreeable to discharge. Pt case discussed with Dr. Patria Mane who agrees  with my plan.        Glade Nurse, PA-C 02/12/13 1711

## 2013-02-13 LAB — URINE CULTURE

## 2013-03-05 ENCOUNTER — Emergency Department (HOSPITAL_COMMUNITY): Payer: Self-pay

## 2013-03-05 ENCOUNTER — Emergency Department (HOSPITAL_COMMUNITY)
Admission: EM | Admit: 2013-03-05 | Discharge: 2013-03-05 | Disposition: A | Discharge status: Home / Self Care | Payer: Self-pay | Attending: Emergency Medicine | Admitting: Emergency Medicine

## 2013-03-05 ENCOUNTER — Encounter (HOSPITAL_COMMUNITY): Payer: Self-pay | Admitting: Emergency Medicine

## 2013-03-05 DIAGNOSIS — F17200 Nicotine dependence, unspecified, uncomplicated: Secondary | ICD-10-CM | POA: Insufficient documentation

## 2013-03-05 DIAGNOSIS — S61012A Laceration without foreign body of left thumb without damage to nail, initial encounter: Secondary | ICD-10-CM

## 2013-03-05 DIAGNOSIS — S61209A Unspecified open wound of unspecified finger without damage to nail, initial encounter: Secondary | ICD-10-CM | POA: Insufficient documentation

## 2013-03-05 DIAGNOSIS — W01119A Fall on same level from slipping, tripping and stumbling with subsequent striking against unspecified sharp object, initial encounter: Secondary | ICD-10-CM | POA: Insufficient documentation

## 2013-03-05 DIAGNOSIS — Z872 Personal history of diseases of the skin and subcutaneous tissue: Secondary | ICD-10-CM | POA: Insufficient documentation

## 2013-03-05 DIAGNOSIS — Z23 Encounter for immunization: Secondary | ICD-10-CM | POA: Insufficient documentation

## 2013-03-05 DIAGNOSIS — Y92009 Unspecified place in unspecified non-institutional (private) residence as the place of occurrence of the external cause: Secondary | ICD-10-CM | POA: Insufficient documentation

## 2013-03-05 DIAGNOSIS — Y9389 Activity, other specified: Secondary | ICD-10-CM | POA: Insufficient documentation

## 2013-03-05 DIAGNOSIS — W268XXA Contact with other sharp object(s), not elsewhere classified, initial encounter: Secondary | ICD-10-CM | POA: Insufficient documentation

## 2013-03-05 DIAGNOSIS — S61219A Laceration without foreign body of unspecified finger without damage to nail, initial encounter: Secondary | ICD-10-CM

## 2013-03-05 MED ORDER — HYDROCODONE-ACETAMINOPHEN 5-325 MG PO TABS: 1.0000 | ORAL_TABLET | Freq: Four times a day (QID) | ORAL | Status: DC | PRN

## 2013-03-05 MED ORDER — OXYCODONE-ACETAMINOPHEN 5-325 MG PO TABS
2.0000 | ORAL_TABLET | Freq: Once | ORAL | Status: AC
  Administered 2013-03-05: 2 via ORAL
  Filled 2013-03-05: qty 2

## 2013-03-05 MED ORDER — TETANUS-DIPHTH-ACELL PERTUSSIS 5-2.5-18.5 LF-MCG/0.5 IM SUSP
0.5000 mL | Freq: Once | INTRAMUSCULAR | Status: AC
  Administered 2013-03-05: 0.5 mL via INTRAMUSCULAR
  Filled 2013-03-05: qty 0.5

## 2013-03-05 NOTE — ED Notes (Signed)
Patient slipped and fell on a glass and it broke when he fell on it   Patient has 2 lacerations on left hand (middle finger and thumb).

## 2013-03-05 NOTE — ED Provider Notes (Signed)
History     CSN: [id number]  Arrival date & time 03/05/13  1321   First MD Initiated Contact with Patient 03/05/13 1350      Chief Complaint  Patient presents with  . Extremity Laceration    left hand    (Consider location/radiation/quality/duration/timing/severity/associated sxs/prior treatment) HPI Comments: Patient is a 32 year old male with no significant past medical history who presents for a laceration to his thumb and middle finger on his left hand. Patient states that he slipped and fell at home where his hand landed on a glass that was resting on the coffee table; glass broke after making contact with hand causing the lacerations. Patient admits to some discomfort in his L thumb and L middle finger without any aggravating or alleviating factors. Patient denies fever, pallor, decreased ROM, or numbness or tingling in his LUE. Date of last tetanus unknown.  The history is provided by the patient. No language interpreter was used.    Past Medical History  Diagnosis Date  . Generalized skin cysts     History reviewed. No pertinent past surgical history.  History reviewed. No pertinent family history.  History  Substance Use Topics  . Smoking status: Current Some Day Smoker  . Smokeless tobacco: Not on file  . Alcohol Use: Yes     Comment: occasional       Review of Systems  Constitutional: Negative for fever.  Musculoskeletal: Positive for myalgias.  Skin: Positive for wound. Negative for color change and pallor.  Neurological: Negative for weakness and numbness.  All other systems reviewed and are negative.    Allergies  Review of patient's allergies indicates no known allergies.  Home Medications   Current Outpatient Rx  Name  Route  Sig  Dispense  Refill  . HYDROcodone-acetaminophen (NORCO/VICODIN) 5-325 MG per tablet   Oral   Take 1 tablet by mouth every 6 (six) hours as needed for pain.   6 tablet   0     BP 138/55  Pulse 82  Temp(Src) 98.1  F (36.7 C)  Resp 18  SpO2 100%  Physical Exam  Nursing note and vitals reviewed. Constitutional: He is oriented to person, place, and time. He appears well-developed and well-nourished. No distress.  HENT:  Head: Normocephalic and atraumatic.  Eyes: No scleral icterus.  Neck: Normal range of motion.  Cardiovascular:  Distal radial pulses 2+ b/l. Capillary refill <2 seconds b/l.  Pulmonary/Chest: Effort normal. No respiratory distress.  Musculoskeletal: Normal range of motion.       Left hand: He exhibits tenderness and laceration. He exhibits normal range of motion, no bony tenderness, normal two-point discrimination, normal capillary refill, no deformity and no swelling. Normal sensation noted. Normal strength noted.       Hands: Patient is able to make tight fist with L hand. 5/5 strength against resistance of FDP, FDS, and extensors of L hand.   Neurological: He is alert and oriented to person, place, and time.  No sensory or motor deficits appreciated.  Skin: Skin is warm and dry. Laceration noted. No rash noted. He is not diaphoretic. No pallor.  Psychiatric: He has a normal mood and affect. His behavior is normal.    ED Course  Procedures (including critical care time)  Labs Reviewed - No data to display Dg Finger Middle Left  03/05/2013  *RADIOLOGY REPORT*  Clinical Data: 32 year old male status post fall with laceration and pain.  LEFT MIDDLE FINGER 2+V  Comparison: The left thumb series from the same  day reported separately.  Findings: Bone mineralization is within normal limits.  Left third phalanges intact.  Joint spaces and alignment within normal limits. Other visualized osseous structures appear intact. No radiopaque foreign body identified.  No subcutaneous gas identified.  IMPRESSION: No acute fracture or dislocation identified about the left third finger.   Original Report Authenticated By: Erskine Speed, M.D.    Dg Finger Thumb Left  03/05/2013  *RADIOLOGY REPORT*   Clinical Data: Fall.  Pain with laceration.  LEFT THUMB 2+V  Comparison: None.  Findings: No evidence of fracture, dislocation or radiopaque foreign object.  There is a well corticated calcific density volar to the interphalangeal joint that probably relates to the joint capsule or tendon.  This is not felt to be an acute traumatic finding.  IMPRESSION: No fracture or radiopaque foreign object.   Original Report Authenticated By: Paulina Fusi, M.D.      1. Thumb laceration, left, initial encounter   2. Laceration of finger of left hand, initial encounter      MDM  Patient presents for laceration to L thumb and L middle finger after slip and fall at home. Patient states hand fell on glass, cutting his thumb and third finger. Patient neurovascularly intact on physical exam with full ROM of LUE and L hand. No sensory or motor deficits appreciated. Xrays negative for fracture and Tdap administered in ED. Lacerations closed with prolene sutures; patient tolerated well. Stable for d/c with Hand specialist follow up and follow up for suture removal in 10-14 days; suture removal at PCP or urgent care advised. Indications for ED return discussed. Patient states comfort and understanding with this d/c plan with no unaddressed concerns.   Filed Vitals:   03/05/13 1335  BP: 138/55  Pulse: 82  Temp: 98.1 F (36.7 C)  Resp: 18  SpO2: 100%     LACERATION REPAIR Performed by: Antony Madura Authorized by: Antony Madura Consent: Verbal consent obtained. Risks and benefits: risks, benefits and alternatives were discussed Consent given by: patient Patient identity confirmed: provided demographic data Prepped and Draped in normal sterile fashion Wound explored  Laceration Location: L thumb on palmar aspect; distal to DIP joint  Laceration Length: 4 cm  No Foreign Bodies seen or palpated  Anesthesia: local infiltration  Local anesthetic: lidocaine 2% without epinephrine  Anesthetic total: 0.5  ml  Irrigation method: syringe Amount of cleaning: standard  Skin closure: 4-0 prolene  Number of sutures: 4  Technique: simple interrupted  Patient tolerance: Patient tolerated the procedure well with no immediate complications.  LACERATION REPAIR Performed by: Antony Madura Authorized by: Antony Madura Consent: Verbal consent obtained. Risks and benefits: risks, benefits and alternatives were discussed Consent given by: patient Patient identity confirmed: provided demographic data Prepped and Draped in normal sterile fashion Wound explored  Laceration Location: distal aspect of L 3rd finger on palmar aspect; distal to DIP joint  Laceration Length: 2cm  No Foreign Bodies seen or palpated  Anesthesia: local infiltration  Local anesthetic: lidocaine 2% without epinephrine  Anesthetic total: 0.5 ml  Irrigation method: syringe Amount of cleaning: standard  Skin closure: 4-0 prolene  Number of sutures: 3  Technique: simple interrupted  Patient tolerance: Patient tolerated the procedure well with no immediate complications.       Antony Madura, PA-C 03/05/13 1609

## 2013-03-06 NOTE — ED Provider Notes (Signed)
Medical screening examination/treatment/procedure(s) were conducted as a shared visit with non-physician practitioner(s) and myself.  I personally evaluated the patient during the encounter  Pt with lacerations that will need repair, is nv intact to fingers on my exam and in no acute distress, needs TDAP Stable appearing  Perry Roller, MD 03/06/13 1315

## 2013-05-13 ENCOUNTER — Emergency Department (INDEPENDENT_AMBULATORY_CARE_PROVIDER_SITE_OTHER)
Admission: EM | Admit: 2013-05-13 | Discharge: 2013-05-13 | Disposition: A | Discharge status: Home / Self Care | Payer: Self-pay | Source: Home / Self Care

## 2013-05-13 ENCOUNTER — Encounter (HOSPITAL_COMMUNITY): Payer: Self-pay | Admitting: Emergency Medicine

## 2013-05-13 DIAGNOSIS — IMO0002 Reserved for concepts with insufficient information to code with codable children: Secondary | ICD-10-CM

## 2013-05-13 DIAGNOSIS — L02419 Cutaneous abscess of limb, unspecified: Secondary | ICD-10-CM

## 2013-05-13 MED ORDER — HYDROMORPHONE HCL 1 MG/ML IJ SOLN: 4.0000 mg | Freq: Once | INTRAMUSCULAR | Status: DC

## 2013-05-13 MED ORDER — CEFTRIAXONE SODIUM 1 G IJ SOLR
INTRAMUSCULAR | Status: AC
  Filled 2013-05-13: qty 10

## 2013-05-13 MED ORDER — LIDOCAINE HCL (PF) 1 % IJ SOLN
INTRAMUSCULAR | Status: AC
  Filled 2013-05-13: qty 5

## 2013-05-13 MED ORDER — CEFTRIAXONE SODIUM 1 G IJ SOLR
1.0000 g | Freq: Once | INTRAMUSCULAR | Status: AC
  Administered 2013-05-13: 1 g via INTRAMUSCULAR

## 2013-05-13 MED ORDER — HYDROCODONE-ACETAMINOPHEN 7.5-325 MG PO TABS: 1.0000 | ORAL_TABLET | Freq: Four times a day (QID) | ORAL | Status: DC | PRN

## 2013-05-13 MED ORDER — HYDROMORPHONE HCL PF 1 MG/ML IJ SOLN
INTRAMUSCULAR | Status: AC
  Filled 2013-05-13: qty 2

## 2013-05-13 MED ORDER — CEPHALEXIN 500 MG PO CAPS: 500.0000 mg | ORAL_CAPSULE | Freq: Four times a day (QID) | ORAL | Status: DC

## 2013-05-13 MED ORDER — HYDROMORPHONE HCL 1 MG/ML IJ SOLN
2.0000 mg | Freq: Once | INTRAMUSCULAR | Status: AC
  Administered 2013-05-13: 2 mg via INTRAMUSCULAR

## 2013-05-13 NOTE — ED Notes (Signed)
Pt c/o abscess under right axillary x 3 days. Pt has used warm compresses with out relief. Pt states "gradually gotten worse over night" denies drainage and fever.

## 2013-05-13 NOTE — ED Provider Notes (Signed)
History    CSN: [id number] Arrival date & time 05/13/13  0903  First MD Initiated Contact with Patient 05/13/13 1003     Chief Complaint  Patient presents with  . Abscess    under right axillary x 3 days.    (Consider location/radiation/quality/duration/timing/severity/associated sxs/prior Treatment) HPI  32yo bm presents with the above complaint.  States that he has had this abscess several times before that required lancing and antibiotics. Most recently jan 2014.  Says that he was told 2 years ago that he would need surgery but never went to have this done.  Increased right axillary swelling and marked discomfort x 3 days.  Says that he usually has chronic irritation in the area but this is more severe.  Denies fever, chills, nausea, arthralgias, drainage.  Past Medical History  Diagnosis Date  . Generalized skin cysts    History reviewed. No pertinent past surgical history. History reviewed. No pertinent family history. History  Substance Use Topics  . Smoking status: Current Some Day Smoker  . Smokeless tobacco: Not on file  . Alcohol Use: Yes     Comment: occasional     Review of Systems  Constitutional: Negative.   HENT: Negative.   Eyes: Negative.   Respiratory: Negative.   Cardiovascular: Negative.   Gastrointestinal: Negative.   Endocrine: Negative.   Genitourinary: Negative.   Musculoskeletal:       Axillary pain and swelling.   Psychiatric/Behavioral: Negative.     Allergies  Review of patient's allergies indicates no known allergies.  Home Medications   Current Outpatient Rx  Name  Route  Sig  Dispense  Refill  . cephALEXin (KEFLEX) 500 MG capsule   Oral   Take 1 capsule (500 mg total) by mouth every 6 (six) hours.   40 capsule   0   . HYDROcodone-acetaminophen (NORCO) 7.5-325 MG per tablet   Oral   Take 1-2 tablets by mouth every 6 (six) hours as needed for pain.   40 tablet   0   . HYDROcodone-acetaminophen (NORCO/VICODIN) 5-325 MG per  tablet   Oral   Take 1 tablet by mouth every 6 (six) hours as needed for pain.   6 tablet   0    BP 147/88  Pulse 85  Temp(Src) 98.5 F (36.9 C) (Oral)  Resp 16  SpO2 100% Physical Exam  Constitutional: He is oriented to person, place, and time. He appears well-developed.  HENT:  Head: Normocephalic and atraumatic.  Eyes: EOM are normal. Pupils are equal, round, and reactive to light.  Neck: Normal range of motion.  Pulmonary/Chest: No respiratory distress.  Musculoskeletal:  Right shoulder decreased ROM due to axillary pain.  There is plum sized axillary abscess that is markedly tender.  Redness around this area.  No drainage.    Neurological: He is alert and oriented to person, place, and time.  Psychiatric: He has a normal mood and affect.    ED Course  INCISION AND DRAINAGE Date/Time: 05/13/2013 11:30 AM Performed by: Zonia Kief Authorized by: Zonia Kief Consent: Verbal consent obtained. Consent given by: patient Patient understanding: patient states understanding of the procedure being performed Patient consent: the patient's understanding of the procedure matches consent given Procedure consent: procedure consent matches procedure scheduled Relevant documents: relevant documents present and verified Site marked: the operative site was not marked Imaging studies: imaging studies not available Required items: required blood products, implants, devices, and special equipment available Patient identity confirmed: verbally with patient Type: abscess Location:  right axilla. Anesthesia: local infiltration Local anesthetic: lidocaine 1% without epinephrine Anesthetic total: 2 ml Patient sedated: no Risk factor: underlying major vessel and underlying major nerve Scalpel size: 11 Incision type: single straight Complexity: simple Drainage: purulent Drainage amount: copious Wound treatment: wound left open Packing material: 1/4 in iodoform gauze Patient tolerance:  Patient tolerated the procedure well with no immediate complications. Comments: Cultures sent    (including critical care time) Labs Reviewed - No data to display No results found. 1. Axillary abscess     MDM  Advised patient that he must call [facility] this week to sched appt.  Advised him that he may need surgery.  Will await culture results.  Given script for keflex today.  F/u here in 2 days for wound check if he has not seen gen surgery.  Voices understanding of treatment plan and risks involved with noncompliance with instructions.    Meds ordered this encounter  Medications  . DISCONTD: HYDROmorphone (DILAUDID) injection 4 mg    Sig:   . cefTRIAXone (ROCEPHIN) injection 1 g    Sig:   . HYDROmorphone (DILAUDID) injection 2 mg    Sig:   . HYDROcodone-acetaminophen (NORCO) 7.5-325 MG per tablet    Sig: Take 1-2 tablets by mouth every 6 (six) hours as needed for pain.    Dispense:  40 tablet    Refill:  0  . cephALEXin (KEFLEX) 500 MG capsule    Sig: Take 1 capsule (500 mg total) by mouth every 6 (six) hours.    Dispense:  40 capsule    Refill:  0    Zonia Kief, PA-C 05/13/13 1138

## 2013-05-15 NOTE — ED Provider Notes (Signed)
Medical screening examination/treatment/procedure(s) were performed by resident physician or non-physician practitioner and as supervising physician I was immediately available for consultation/collaboration.   Barkley Bruns MD.   Linna Hoff, MD 05/15/13 1110

## 2013-05-21 ENCOUNTER — Telehealth (HOSPITAL_COMMUNITY): Payer: Self-pay | Admitting: *Deleted

## 2013-05-21 LAB — CULTURE, ROUTINE-ABSCESS: Gram Stain: NONE SEEN

## 2013-05-21 NOTE — ED Notes (Addendum)
Abscess culture R axilla: Multiple bacterial types none predominant.  Still shows as pending.  I called lab and they are checking on it. Pt. treated with I and D, Rocephin 1 gm IM and Keflex.  7/7  Zonia Kief sent message for me to call pt. to make sure he f/u with a general surgeon. I called pt. and left a message to call.  Call 1. Vassie Moselle 05/21/2013

## 2013-05-21 NOTE — ED Notes (Signed)
Lab called back and said the culture is final as result previously noted.

## 2013-05-22 ENCOUNTER — Telehealth (HOSPITAL_COMMUNITY): Payer: Self-pay | Admitting: *Deleted

## 2013-05-23 ENCOUNTER — Telehealth (HOSPITAL_COMMUNITY): Payer: Self-pay | Admitting: *Deleted

## 2013-05-23 NOTE — ED Notes (Signed)
Pt. called back.  Pt. verified x 2 and given results.  Pt. instructed to f/u with general surgeon. Pt. States he has not called for f/u appt. Because he does not have insurance. He said it is better but still draining a little. I told him, he should call and tell them that we referred him for follow-up and they should schedule one follow-up visit.  He can talk to their billing person and set up a payment plan.  Pt. voiced understanding and said he would call. Vassie Moselle 05/23/2013

## 2013-05-24 ENCOUNTER — Encounter (INDEPENDENT_AMBULATORY_CARE_PROVIDER_SITE_OTHER): Payer: Self-pay | Admitting: Surgery

## 2013-05-29 ENCOUNTER — Ambulatory Visit (INDEPENDENT_AMBULATORY_CARE_PROVIDER_SITE_OTHER): Payer: Self-pay | Admitting: General Surgery

## 2013-05-29 ENCOUNTER — Encounter (INDEPENDENT_AMBULATORY_CARE_PROVIDER_SITE_OTHER): Payer: Self-pay | Admitting: General Surgery

## 2013-05-29 VITALS — BP 122/78 | HR 66 | Resp 16 | Ht 72.0 in | Wt 147.2 lb

## 2013-05-29 DIAGNOSIS — L732 Hidradenitis suppurativa: Secondary | ICD-10-CM

## 2013-05-29 NOTE — Patient Instructions (Signed)
Hidradenitis Suppurativa, Sweat Gland Abscess Hidradenitis suppurativa is a long lasting (chronic), uncommon disease of the sweat glands. With this, boil-like lumps and scarring develop in the groin, some times under the arms (axillae), and under the breasts. It may also uncommonly occur behind the ears, in the crease of the buttocks, and around the genitals.  CAUSES  The cause is from a blocking of the sweat glands. They then become infected. It may cause drainage and odor. It is not contagious. So it cannot be given to someone else. It most often shows up in puberty (about 69 to 32 years of age). But it may happen much later. It is similar to acne which is a disease of the sweat glands. This condition is slightly more common in African-Americans and women. SYMPTOMS   Hidradenitis usually starts as one or more red, tender, swellings in the groin or under the arms (axilla).  Over a period of hours to days the lesions get larger. They often open to the skin surface, draining clear to yellow-colored fluid.  The infected area heals with scarring. DIAGNOSIS  Your caregiver makes this diagnosis by looking at you. Sometimes cultures (growing germs on plates in the lab) may be taken. This is to see what germ (bacterium) is causing the infection.  TREATMENT   Topical germ killing medicine applied to the skin (antibiotics) are the treatment of choice. Antibiotics taken by mouth (systemic) are sometimes needed when the condition is getting worse or is severe.  Avoid tight-fitting clothing which traps moisture in.  Dirt does not cause hidradenitis and it is not caused by poor hygiene.  Involved areas should be cleaned daily using an antibacterial soap. Some patients find that the liquid form of Lever 2000, applied to the involved areas as a lotion after bathing, can help reduce the odor related to this condition.  Sometimes surgery is needed to drain infected areas or remove scarred tissue. Removal of  large amounts of tissue is used only in severe cases.  Birth control pills may be helpful.  Oral retinoids (vitamin A derivatives) for 6 to 12 months which are effective for acne may also help this condition.  Weight loss will improve but not cure hidradenitis. It is made worse by being overweight. But the condition is not caused by being overweight.  This condition is more common in people who have had acne.  It may become worse under stress. There is no medical cure for hidradenitis. It can be controlled, but not cured. The condition usually continues for years with periods of getting worse and getting better (remission).   Keep your underarms shaved.  Clean underarms with Hibiclens soap (can get this at the drug store or [employer]) daily for 7 days.  Avoid putting deodorant or anti-perspirant on the area.  Call if the situation gets worse.

## 2013-05-29 NOTE — Progress Notes (Signed)
Patient ID: Perry Reilly, male   DOB: 1981-02-08, 32 y.o.   MRN: [id number]  Chief Complaint  Patient presents with  . New Evaluation    urgent/ abscess    HPI Perry Reilly is a 32 y.o. male.   HPI  He is referred by Dr. Bradd Canary for evaluation of recurrent right axillary abscesses.  He was seen at [facility] and underwent incision and drainage of right axillary abscess earlier this month. He states she's had these multiple times. He states family members have had these. He states he he's had it once in the  left axillary area.  Since the incision and drainage, the swelling has decreased significantly but he still having a little bit of drainage.  Past Medical History  Diagnosis Date  . Generalized skin cysts     Past Surgical History  Procedure Laterality Date  . Hand tendon surgery      History reviewed. No pertinent family history.  Social History History  Substance Use Topics  . Smoking status: Light Tobacco Smoker  . Smokeless tobacco: Not on file  . Alcohol Use: Yes     Comment: occasional     No Known Allergies  Current Outpatient Prescriptions  Medication Sig Dispense Refill  . cephALEXin (KEFLEX) 500 MG capsule Take 1 capsule (500 mg total) by mouth every 6 (six) hours.  40 capsule  0  . HYDROcodone-acetaminophen (NORCO) 7.5-325 MG per tablet Take 1-2 tablets by mouth every 6 (six) hours as needed for pain.  40 tablet  0   No current facility-administered medications for this visit.    Review of Systems Review of Systems  Constitutional: Negative.   Respiratory: Negative.   Cardiovascular: Negative.     Blood pressure 122/78, pulse 66, resp. rate 16, height 6' (1.829 m), weight 147 lb 3.2 oz (66.769 kg).  Physical Exam Physical Exam  Constitutional: He appears well-developed and well-nourished. No distress.  Musculoskeletal:  Some right axillary swelling and scarring. One punctate wound draining a little bit of cloudy fluid. No erythema  or tenderness.  Small left axillary scar is present.    Data Reviewed Note from urgent care  Assessment    Axillary hidradenitis operative mostly on the right side. No abscess to drain at this time. I discussed with him the etiology and treatment of this disease process.     Plan    He was instructed to shave the axillary hair and keep in shape. I instructed them to wash the right axillary area with Hibiclens soap daily for a week. He was told to avoid applying the odor at the prospective the area. If it recurs or gets worse he was told to call back to make an appointment.        Arfa Lamarca J 05/29/2013, 5:24 PM

## 2016-07-21 ENCOUNTER — Emergency Department (HOSPITAL_COMMUNITY)
Admission: EM | Admit: 2016-07-21 | Discharge: 2016-07-21 | Disposition: A | Discharge status: Home / Self Care | Payer: Self-pay | Attending: Emergency Medicine | Admitting: Emergency Medicine

## 2016-07-21 ENCOUNTER — Encounter (HOSPITAL_COMMUNITY): Payer: Self-pay

## 2016-07-21 DIAGNOSIS — L309 Dermatitis, unspecified: Secondary | ICD-10-CM | POA: Insufficient documentation

## 2016-07-21 DIAGNOSIS — F17200 Nicotine dependence, unspecified, uncomplicated: Secondary | ICD-10-CM | POA: Insufficient documentation

## 2016-07-21 MED ORDER — CEPHALEXIN 500 MG PO CAPS: 500.0000 mg | ORAL_CAPSULE | Freq: Four times a day (QID) | ORAL | 0 refills | Status: DC

## 2016-07-21 MED ORDER — TRIAMCINOLONE ACETONIDE 0.1 % EX CREA: 1.0000 "application " | TOPICAL_CREAM | Freq: Four times a day (QID) | CUTANEOUS | 0 refills | Status: DC | PRN

## 2016-07-21 NOTE — Discharge Instructions (Signed)
Read the information below.  Use the prescribed medication as directed.  Please discuss all new medications with your pharmacist.  You may return to the Emergency Department at any time for worsening condition or any new symptoms that concern you.     If you develop redness, swelling, pus draining from the wound, or fevers greater than 100.4, return to the ER immediately for a recheck.   °

## 2016-07-21 NOTE — ED Triage Notes (Signed)
Pt complains of a scaley rash on both lower legs for about two months, nothing OTC works.

## 2016-07-21 NOTE — ED Provider Notes (Signed)
[facility]-EMERGENCY DEPT Provider Note   CSN: [id number] Arrival date & time: 07/21/16  2057     History   Chief Complaint Chief Complaint  Patient presents with  . Rash    HPI Perry Reilly is a 35 y.o. male.  HPI   Pt p/w intermittently pruritic scaly rash over bilateral lower extremities that began several months ago.  The areas have increased in size.  Has been putting athlete's foot ointment on them without improvement.  The area on his left leg started aching last night, clear discharge.  Denies fevers, chills, body aches, leg swelling, weakness or numbness of the legs.  Denies any chemical exposure, any abnormal contact exposure with this legs, change in personal care products, insect exposure.  No one else in his household has this.  No personal or family hx of autoimmune disorders or eczema.    Past Medical History:  Diagnosis Date  . Generalized skin cysts     Patient Active Problem List   Diagnosis Date Noted  . Hidradenitis suppurativa of right axilla 05/29/2013    Past Surgical History:  Procedure Laterality Date  . HAND TENDON SURGERY         Home Medications    Prior to Admission medications   Medication Sig Start Date End Date Taking? Authorizing Provider  cephALEXin (KEFLEX) 500 MG capsule Take 1 capsule (500 mg total) by mouth 4 (four) times daily. 07/21/16   [name], PA-C  triamcinolone cream (KENALOG) 0.1 % Apply 1 application topically 4 (four) times daily as needed. 07/21/16   [name], PA-C    Family History History reviewed. No pertinent family history.  Social History Social History  Substance Use Topics  . Smoking status: Light Tobacco Smoker  . Smokeless tobacco: Never Used  . Alcohol use Yes     Comment: occasional      Allergies   Review of patient's allergies indicates no known allergies.   Review of Systems Review of Systems  Constitutional: Negative for chills and fever.  Cardiovascular: Negative for leg swelling.    Musculoskeletal: Negative for joint swelling and myalgias.  Skin: Positive for rash.  Allergic/Immunologic: Negative for immunocompromised state.  Neurological: Negative for weakness and numbness.  Hematological: Does not bruise/bleed easily.  Psychiatric/Behavioral: Negative for self-injury.     Physical Exam Updated Vital Signs BP 139/62 (BP Location: Left Arm)   Pulse 75   Temp 98.1 F (36.7 C)   Resp 20   SpO2 100%   Physical Exam  Constitutional: He appears well-developed and well-nourished. No distress.  HENT:  Head: Normocephalic and atraumatic.  Neck: Neck supple.  Cardiovascular: Normal rate and intact distal pulses.   Pulmonary/Chest: Effort normal.  Musculoskeletal: He exhibits no edema or deformity.  Neurological: He is alert.  Skin: He is not diaphoretic.  Left lower leg with irregularly shaped patch of grey scale over medial lower leg.  Several openings where scale has been excoriated.  Suggestion of slight erythema and mild tenderness.  No discharge.  Right lower leg with two small lesions that are similar, oval/irregular grey scale.    Nursing note and vitals reviewed.    ED Treatments / Results  Labs (all labs ordered are listed, but only abnormal results are displayed) Labs Reviewed - No data to display  EKG  EKG Interpretation None       Radiology No results found.  Procedures Procedures (including critical care time)  Medications Ordered in ED Medications - No data to display  Initial Impression / Assessment and Plan / ED Course  I have reviewed the triage vital signs and the nursing notes.  Pertinent labs & imaging results that were available during my care of the patient were reviewed by me and considered in my medical decision making (see chart for details).  Clinical Course    Afebrile, nontoxic patient with several months of pruritic scaly skin lesions of the lower legs.  Question contact dermatitis vs psoriasis? With  early/mild infection from scratching open the area.  D/C home with steroid cream plus keflex for possible early superinfection.  PCP resources for follow up.  Dermatology referral.  Discussed result, findings, treatment, and follow up  with patient.  Pt given return precautions.  Pt verbalizes understanding and agrees with plan.       Final Clinical Impressions(s) / ED Diagnoses   Final diagnoses:  Dermatitis    New Prescriptions New Prescriptions   CEPHALEXIN (KEFLEX) 500 MG CAPSULE    Take 1 capsule (500 mg total) by mouth 4 (four) times daily.   TRIAMCINOLONE CREAM (KENALOG) 0.1 %    Apply 1 application topically 4 (four) times daily as needed.     Trixie Dredgemily Danell Verno, PA-C 07/21/16 2200    Shaune Pollackameron Isaacs, MD 07/22/16 1240

## 2017-01-04 ENCOUNTER — Encounter (HOSPITAL_COMMUNITY): Payer: Self-pay | Admitting: Emergency Medicine

## 2017-01-04 ENCOUNTER — Emergency Department (HOSPITAL_COMMUNITY)
Admission: EM | Admit: 2017-01-04 | Discharge: 2017-01-04 | Disposition: A | Discharge status: Home / Self Care | Payer: Self-pay | Attending: Emergency Medicine | Admitting: Emergency Medicine

## 2017-01-04 DIAGNOSIS — H5712 Ocular pain, left eye: Secondary | ICD-10-CM | POA: Insufficient documentation

## 2017-01-04 DIAGNOSIS — F17200 Nicotine dependence, unspecified, uncomplicated: Secondary | ICD-10-CM | POA: Insufficient documentation

## 2017-01-04 MED ORDER — FLUORESCEIN SODIUM 0.6 MG OP STRP
1.0000 | ORAL_STRIP | Freq: Once | OPHTHALMIC | Status: AC
  Administered 2017-01-04: 1 via OPHTHALMIC
  Filled 2017-01-04: qty 1

## 2017-01-04 MED ORDER — TETRACAINE HCL 0.5 % OP SOLN
2.0000 [drp] | Freq: Once | OPHTHALMIC | Status: AC
  Administered 2017-01-04: 2 [drp] via OPHTHALMIC
  Filled 2017-01-04: qty 4

## 2017-01-04 NOTE — ED Triage Notes (Signed)
Patient c/o left eye pain x3 days. Reports "it feels like something is in my eye." Reports clear drainage. Denies injury.

## 2017-01-04 NOTE — Discharge Instructions (Signed)
Please go directly to the ophthalmologist office immediately following discharge. The address is listed in this discharge summary. Make sure to have someone else drive you to the office.

## 2017-01-04 NOTE — ED Provider Notes (Signed)
[facility]-EMERGENCY DEPT Provider Note   CSN: [id number] Arrival date & time: 01/04/17  1156  By signing my name below, I, Teofilo Pod, attest that this documentation has been prepared under the direction and in the presence of Brecken Lyndia Bury, PA-C. Electronically Signed: Teofilo Pod, ED Scribe. 01/04/2017. 12:48 PM.   History   Chief Complaint Chief Complaint  Patient presents with  . Eye Pain    The history is provided by the patient. No language interpreter was used.   HPI Comments:  Perry Reilly is a 36 y.o. male who presents to the Emergency Department complaining of constant left eye pain x 3 days. Pt reports clear drainage, and states that he feels like there in something in his eye. His pain suddenly worsened last night. He reports green/yellow crustiness on his left eye when he woke up today. Pt does not wear corrective lenses. Pt denies any family hx of eye problems. Pt is an occasional smoker, and reports hx of gonorrhea. No alleviating factors noted. Pt denies fever, visual changes, or any other complaints.     Past Medical History:  Diagnosis Date  . Generalized skin cysts     Patient Active Problem List   Diagnosis Date Noted  . Hidradenitis suppurativa of right axilla 05/29/2013    Past Surgical History:  Procedure Laterality Date  . HAND TENDON SURGERY         Home Medications    Prior to Admission medications   Medication Sig Start Date End Date Taking? Authorizing Provider  cephALEXin (KEFLEX) 500 MG capsule Take 1 capsule (500 mg total) by mouth 4 (four) times daily. 07/21/16   [name], PA-C  triamcinolone cream (KENALOG) 0.1 % Apply 1 application topically 4 (four) times daily as needed. 07/21/16   [name], PA-C    Family History No family history on file.  Social History Social History  Substance Use Topics  . Smoking status: Light Tobacco Smoker  . Smokeless tobacco: Never Used  . Alcohol use Yes     Comment: occasional       Allergies   Patient has no known allergies.   Review of Systems Review of Systems  Constitutional: Negative for fever.  HENT: Negative for facial swelling.   Eyes: Positive for pain and discharge. Negative for visual disturbance.     Physical Exam Updated Vital Signs BP 130/64 (BP Location: Left Arm)   Pulse 71   Temp 98.4 F (36.9 C) (Oral)   Resp 16   Ht 6' (1.829 m)   Wt 65 kg   SpO2 99%   BMI 19.45 kg/m   Physical Exam  Constitutional: He appears well-developed and well-nourished. No distress.  HENT:  Head: Normocephalic and atraumatic.  Eyes: Conjunctivae and EOM are normal. Pupils are equal, round, and reactive to light. Left conjunctiva is not injected.  No scleral injection. Some swelling, erythema, and tenderness to upper left lid with no discharge from the eye noted. Tenderness without noted swelling or discharge over left medial canthus.   No contact lenses in place.  Woods Lamp exam shows no increased uptake of fluorescein. Slit lamp exam was also performed with no noted signs of corneal abrasion or ulcer, iritis, anterior chamber damage, foreign bodies, or globe damage.  Tono-Pen values: Right eye: 27, 23, 25  Left eye: 26, 26, 23    Visual Acuity  Right Eye Distance: 20/25 Left Eye Distance: 20/20 Bilateral Distance: 20/20  Right Eye Near:   Left Eye Near:  Bilateral Near:     Cardiovascular: Normal rate.   Pulmonary/Chest: Effort normal.  Abdominal: He exhibits no distension.  Neurological: He is alert.  Skin: Skin is warm and dry.  Psychiatric: He has a normal mood and affect.  Nursing note and vitals reviewed.    ED Treatments / Results  DIAGNOSTIC STUDIES:  Oxygen Saturation is 99% on RA, normal by my interpretation.    COORDINATION OF CARE:  12:38 PM Discussed treatment plan with pt at bedside and pt agreed to plan.   Labs (all labs ordered are listed, but only abnormal results are displayed) Labs Reviewed - No data to  display  EKG  EKG Interpretation None       Radiology No results found.  Procedures Procedures (including critical care time)  Medications Ordered in ED Medications  fluorescein ophthalmic strip 1 strip (1 strip Left Eye Given 01/04/17 1302)  tetracaine (PONTOCAINE) 0.5 % ophthalmic solution 2 drop (2 drops Both Eyes Given 01/04/17 1302)     Initial Impression / Assessment and Plan / ED Course  I have reviewed the triage vital signs and the nursing notes.  Pertinent labs & imaging results that were available during my care of the patient were reviewed by me and considered in my medical decision making (see chart for details).     Patient presents with eye pain. Ophthalmology consult due to increased eye pressures, although they are bilateral, as well as the patient's reported drainage and history of gonorrhea Patient appears appropriate for office follow-up. 1:11 PM Spoke with nurse from Dr. Marnee SpringMcCuen's ophthalmology office. Advise to send the patient to the office upon discharge from the ED. This information was communicated with the patient, who agreed to the plan.  Vitals:   01/04/17 1203 01/04/17 1329  BP: 130/64   Pulse: 71 77  Resp: 16   Temp: 98.4 F (36.9 C)   TempSrc: Oral   SpO2: 99% 99%  Weight: 65 kg   Height: 6' (1.829 m)      Final Clinical Impressions(s) / ED Diagnoses   Final diagnoses:  Left eye pain    New Prescriptions Discharge Medication List as of 01/04/2017  1:15 PM     I personally performed the services described in this documentation, which was scribed in my presence. The recorded information has been reviewed and is accurate.    Anselm PancoastShawn C Anastasia Tompson, PA-C 01/04/17 1959    Lorre NickAnthony Allen, MD 01/05/17 1311

## 2018-01-18 ENCOUNTER — Emergency Department (HOSPITAL_COMMUNITY)
Admission: EM | Admit: 2018-01-18 | Discharge: 2018-01-18 | Disposition: A | Discharge status: Home / Self Care | Payer: Self-pay | Attending: Emergency Medicine | Admitting: Emergency Medicine

## 2018-01-18 ENCOUNTER — Encounter (HOSPITAL_COMMUNITY): Payer: Self-pay | Admitting: Emergency Medicine

## 2018-01-18 DIAGNOSIS — Z5321 Procedure and treatment not carried out due to patient leaving prior to being seen by health care provider: Secondary | ICD-10-CM | POA: Insufficient documentation

## 2018-01-18 DIAGNOSIS — L02411 Cutaneous abscess of right axilla: Secondary | ICD-10-CM | POA: Insufficient documentation

## 2018-01-18 NOTE — ED Notes (Signed)
Patient called for room placement with no answer. 

## 2018-01-18 NOTE — ED Triage Notes (Signed)
Patient c/o abscess to right axilla x4 days. Denies drainage. Hx of same.

## 2018-01-18 NOTE — ED Notes (Signed)
No response

## 2018-01-19 ENCOUNTER — Encounter (HOSPITAL_BASED_OUTPATIENT_CLINIC_OR_DEPARTMENT_OTHER): Payer: Self-pay

## 2018-01-19 ENCOUNTER — Emergency Department (HOSPITAL_BASED_OUTPATIENT_CLINIC_OR_DEPARTMENT_OTHER)
Admission: EM | Admit: 2018-01-19 | Discharge: 2018-01-20 | Disposition: A | Discharge status: Home / Self Care | Payer: Self-pay | Attending: Emergency Medicine | Admitting: Emergency Medicine

## 2018-01-19 DIAGNOSIS — L02411 Cutaneous abscess of right axilla: Secondary | ICD-10-CM | POA: Insufficient documentation

## 2018-01-19 DIAGNOSIS — F17200 Nicotine dependence, unspecified, uncomplicated: Secondary | ICD-10-CM | POA: Insufficient documentation

## 2018-01-19 MED ORDER — LIDOCAINE HCL (PF) 1 % IJ SOLN
30.0000 mL | Freq: Once | INTRAMUSCULAR | Status: DC
  Filled 2018-01-19: qty 30

## 2018-01-19 NOTE — ED Triage Notes (Signed)
C/o right axilla abscess 4 days-NAD-steady gait

## 2018-01-19 NOTE — ED Notes (Signed)
Follow up call completed. 01/19/2018,

## 2018-01-20 MED ORDER — DOXYCYCLINE HYCLATE 100 MG PO CAPS: 100.0000 mg | ORAL_CAPSULE | Freq: Two times a day (BID) | ORAL | 0 refills | Status: AC

## 2018-01-20 MED ORDER — OXYCODONE-ACETAMINOPHEN 5-325 MG PO TABS: 1.0000 | ORAL_TABLET | Freq: Three times a day (TID) | ORAL | 0 refills | Status: DC | PRN

## 2018-01-20 NOTE — ED Provider Notes (Signed)
[facility] EMERGENCY DEPARTMENT Provider Note   CSN: [id number] Arrival date & time: 01/19/18  1846     History   Chief Complaint Chief Complaint  Patient presents with  . Abscess    HPI Perry Reilly is a 37 y.o. male with a past medical history of hidradenitis, recurrent skin abscesses, who presents to ED for evaluation of 5-day history of right axilla abscess.  He reports similar symptoms in the past and several parts of his body including axilla, groin, buttock.  He has not tried any home remedies to help with symptoms.  This feels similar to his prior abscesses.  He usually gets them incised and drained in clinic or ED but has not had any surgical evaluation for them.  Denies any fevers, trauma to area, numbness in arms.  HPI  Past Medical History:  Diagnosis Date  . Generalized skin cysts     Patient Active Problem List   Diagnosis Date Noted  . Hidradenitis suppurativa of right axilla 05/29/2013    Past Surgical History:  Procedure Laterality Date  . HAND TENDON SURGERY         Home Medications    Prior to Admission medications   Medication Sig Start Date End Date Taking? Authorizing Provider  doxycycline (VIBRAMYCIN) 100 MG capsule Take 1 capsule (100 mg total) by mouth 2 (two) times daily for 7 days. 01/20/18 01/27/18  Mahreen Schewe, Hillary Bow, PA-C  oxyCODONE-acetaminophen (PERCOCET/ROXICET) 5-325 MG tablet Take 1 tablet by mouth every 8 (eight) hours as needed for severe pain. 01/20/18   Dietrich Pates, PA-C    Family History No family history on file.  Social History Social History   Tobacco Use  . Smoking status: Current Every Day Smoker  . Smokeless tobacco: Never Used  Substance Use Topics  . Alcohol use: Yes    Comment: occasional   . Drug use: No     Allergies   Patient has no known allergies.   Review of Systems Review of Systems  Constitutional: Negative for appetite change, chills and fever.  HENT: Negative for ear pain, rhinorrhea,  sneezing and sore throat.   Eyes: Negative for photophobia and visual disturbance.  Respiratory: Negative for cough, chest tightness, shortness of breath and wheezing.   Cardiovascular: Negative for chest pain and palpitations.  Gastrointestinal: Negative for abdominal pain, blood in stool, constipation, diarrhea, nausea and vomiting.  Genitourinary: Negative for dysuria, hematuria and urgency.  Musculoskeletal: Negative for myalgias.  Skin: Positive for color change. Negative for rash.  Neurological: Negative for dizziness, weakness and light-headedness.     Physical Exam Updated Vital Signs BP (!) 142/72 (BP Location: Right Arm)   Pulse 75   Temp 99.1 F (37.3 C) (Oral)   Resp 18   Ht 6' (1.829 m)   Wt 69.5 kg (153 lb 3.5 oz)   SpO2 100%   BMI 20.78 kg/m   Physical Exam  Constitutional: He appears well-developed and well-nourished. No distress.  Nontoxic appearing and in no acute distress.  Appears comfortable.  HENT:  Head: Normocephalic and atraumatic.  Nose: Nose normal.  Eyes: Conjunctivae and EOM are normal. Left eye exhibits no discharge. No scleral icterus.  Neck: Normal range of motion. Neck supple.  Cardiovascular: Normal rate, regular rhythm, normal heart sounds and intact distal pulses. Exam reveals no gallop and no friction rub.  No murmur heard. Pulmonary/Chest: Effort normal and breath sounds normal. No respiratory distress.  Abdominal: Soft. Bowel sounds are normal. He exhibits no distension. There  is no tenderness. There is no guarding.  Musculoskeletal: Normal range of motion. He exhibits no edema or tenderness.  Neurological: He is alert. He exhibits normal muscle tone. Coordination normal.  Skin: Skin is warm and dry. No rash noted.  Approximately 3 cm area of induration noted to right axilla.  No drainage, overlying erythema noted. No fluctuance noted.  Psychiatric: He has a normal mood and affect.  Nursing note and vitals reviewed.    ED Treatments  / Results  Labs (all labs ordered are listed, but only abnormal results are displayed) Labs Reviewed - No data to display  EKG  EKG Interpretation None       Radiology No results found.   EMERGENCY DEPARTMENT US SOFT TISSUE INTERPRETATION "Study: Limited Soft Tissue Ultrasound"  INDICATIONS: Pain Multiple views of the body part were obtained in real-time with a multi-frequency linear probe  PERFORMED BY: Myself IMAGES ARCHIVED?: No SIDE:Right  BODY PART:Axilla INTERPRETATION:  Abcess present and Cellulitis present    Procedures .Marland KitchenIncision and Drainage Date/Time: 01/20/2018 12:13 AM Performed by: Dietrich Pates, PA-C Authorized by: Dietrich Pates, PA-C   Consent:    Consent obtained:  Verbal   Consent given by:  Patient   Risks discussed:  Bleeding, damage to other organs, incomplete drainage, infection and pain Location:    Type:  Abscess   Size:  3   Location:  Upper extremity   Upper extremity location:  Arm   Arm location:  R upper arm (R axilla) Pre-procedure details:    Skin preparation:  Betadine Anesthesia (see MAR for exact dosages):    Anesthesia method:  Local infiltration   Local anesthetic:  Lidocaine 1% w/o epi Procedure type:    Complexity:  Simple Procedure details:    Needle aspiration: yes     Needle size:  18 G   Incision types:  Stab incision   Scalpel blade:  11   Wound management:  Irrigated with saline   Drainage:  Bloody and purulent   Drainage amount:  Scant   Wound treatment:  Wound left open   Packing materials:  None Post-procedure details:    Patient tolerance of procedure:  Tolerated with difficulty    (including critical care time)  Medications Ordered in ED Medications  lidocaine (PF) (XYLOCAINE) 1 % injection 30 mL (not administered)     Initial Impression / Assessment and Plan / ED Course  I have reviewed the triage vital signs and the nursing notes.  Pertinent labs & imaging results that were available during my  care of the patient were reviewed by me and considered in my medical decision making (see chart for details).     Patient presents to ED for evaluation of R axilla abscess for the past 5 days.  He has had a history of similar symptoms in the past in his right axilla and other parts of his body including groin, buttock.  He reports pain and swelling at the site.  He has not tried any home remedies to help with symptoms. On physical exam there is induration of the area of the R axilla with tenderness to palpation. No fluctuance noted. Area is approximately 3cm in diameter. There is no overlying erythema noted. He is afebrile with no history of fever. Soft tissue u/s done at bedside showed area of cellulitis and abscess, which is most likely the cause of his symptoms. Area was attempted to be incised and drained with scant bloody/purulent drainage noted. Patient tolerated procedure with difficulty and  was uncomfortable. Due to possibility of incomplete drainage, will place on antibiotics that will also cover for MRSA since patient reports history of symptoms in the past. Will give rx for doxycycline, pain medication to be taken as needed. Advised to continue warm compresses to help with drainage. Will refer to surgery center for further evaluation due to recurrent sx. [place] narcotic database reviewed with no discrepancies. Patient appears stable for discharge at this time. Strict return precautions given.  Portions of this note were generated with Scientist, clinical (histocompatibility and immunogenetics)Dragon dictation software. Dictation errors may occur despite best attempts at proofreading.   Final Clinical Impressions(s) / ED Diagnoses   Final diagnoses:  Abscess of axilla, right    ED Discharge Orders        Ordered    doxycycline (VIBRAMYCIN) 100 MG capsule  2 times daily     01/20/18 0010    oxyCODONE-acetaminophen (PERCOCET/ROXICET) 5-325 MG tablet  Every 8 hours PRN     01/20/18 0010       Dietrich PatesKhatri, Joyelle Siedlecki, PA-C 01/20/18 0024    Tegeler,  Canary Brimhristopher J, MD 01/20/18 [phone]

## 2018-01-20 NOTE — Discharge Instructions (Signed)
Please read attached information regarding your condition. Take doxycycline twice daily for the next 7 days to help with the infection. Apply warm compresses to area to help with drainage. Take pain medication as needed for severe pain. Return to ED for worsening symptoms, increased swelling, fevers, injury or trauma to area.

## 2018-08-24 ENCOUNTER — Encounter (HOSPITAL_COMMUNITY): Payer: Self-pay | Admitting: Emergency Medicine

## 2018-08-24 ENCOUNTER — Other Ambulatory Visit: Payer: Self-pay

## 2018-08-24 ENCOUNTER — Emergency Department (HOSPITAL_COMMUNITY): Payer: Self-pay

## 2018-08-24 ENCOUNTER — Emergency Department (HOSPITAL_COMMUNITY)
Admission: EM | Admit: 2018-08-24 | Discharge: 2018-08-24 | Disposition: A | Discharge status: Home / Self Care | Payer: Self-pay | Attending: Emergency Medicine | Admitting: Emergency Medicine

## 2018-08-24 DIAGNOSIS — W19XXXA Unspecified fall, initial encounter: Secondary | ICD-10-CM

## 2018-08-24 DIAGNOSIS — M791 Myalgia, unspecified site: Secondary | ICD-10-CM | POA: Insufficient documentation

## 2018-08-24 DIAGNOSIS — W108XXA Fall (on) (from) other stairs and steps, initial encounter: Secondary | ICD-10-CM | POA: Insufficient documentation

## 2018-08-24 DIAGNOSIS — I861 Scrotal varices: Secondary | ICD-10-CM | POA: Insufficient documentation

## 2018-08-24 DIAGNOSIS — N433 Hydrocele, unspecified: Secondary | ICD-10-CM | POA: Insufficient documentation

## 2018-08-24 DIAGNOSIS — F17200 Nicotine dependence, unspecified, uncomplicated: Secondary | ICD-10-CM | POA: Insufficient documentation

## 2018-08-24 LAB — URINALYSIS, ROUTINE W REFLEX MICROSCOPIC
BILIRUBIN URINE: NEGATIVE
Bacteria, UA: NONE SEEN
Glucose, UA: NEGATIVE mg/dL
Hgb urine dipstick: NEGATIVE
KETONES UR: NEGATIVE mg/dL
Nitrite: NEGATIVE
PH: 5 (ref 5.0–8.0)
Protein, ur: NEGATIVE mg/dL
Specific Gravity, Urine: 1.029 (ref 1.005–1.030)

## 2018-08-24 MED ORDER — CYCLOBENZAPRINE HCL 10 MG PO TABS: 5.0000 mg | ORAL_TABLET | Freq: Two times a day (BID) | ORAL | 0 refills | Status: AC | PRN

## 2018-08-24 MED ORDER — CEFTRIAXONE SODIUM 250 MG IJ SOLR
250.0000 mg | Freq: Once | INTRAMUSCULAR | Status: AC
  Administered 2018-08-24: 250 mg via INTRAMUSCULAR
  Filled 2018-08-24: qty 250

## 2018-08-24 MED ORDER — LIDOCAINE HCL 2 % IJ SOLN
INTRAMUSCULAR | Status: AC
  Administered 2018-08-24: 30 mg
  Filled 2018-08-24: qty 20

## 2018-08-24 MED ORDER — AZITHROMYCIN 250 MG PO TABS
1000.0000 mg | ORAL_TABLET | Freq: Once | ORAL | Status: AC
  Administered 2018-08-24: 1000 mg via ORAL
  Filled 2018-08-24: qty 4

## 2018-08-24 NOTE — ED Triage Notes (Signed)
Pt c/o low back pain after a slip and fall on the steps 4 days ago.Denies dizziness, denies striking head.  Pt also c/o tenderness in r/testicle x 3 days. Denies penile discharge. Tx back pain with motrin and ice packs

## 2018-08-24 NOTE — Discharge Instructions (Addendum)
You may alternate taking Tylenol and Ibuprofen as needed for pain control. You may take 400-600 mg of ibuprofen every 6 hours and [phone] mg of Tylenol every 6 hours. Do not exceed 4000 mg of Tylenol daily as this can lead to liver damage. Also, make sure to take Ibuprofen with meals as it can cause an upset stomach. Do not take other NSAIDs while taking Ibuprofen such as (Aleve, Naprosyn, Aspirin, Celebrex, etc) and do not take more than the prescribed dose as this can lead to ulcers and bleeding in your GI tract. You may use warm and cold compresses to help with your symptoms.   You were given a prescription for Flexeril which is a muscle relaxer.  You should not drive, work, or operate machinery while taking this medication as it can make you very drowsy.     Please follow up with your primary doctor within the next 7-10 days for re-evaluation and further treatment of your symptoms.   You were given a referral to the urology doctor.  You should call the office to make an appointment in the next 1 to 2 weeks for reevaluation in regards to your hydroceles and varicocele.  Please return to the ER sooner if you have any new or worsening symptoms.

## 2018-08-24 NOTE — ED Provider Notes (Signed)
[facility]-EMERGENCY DEPT Provider Note   CSN: [id number] Arrival date & time: 08/24/18  1552     History   Chief Complaint Chief Complaint  Patient presents with  . Fall  . Back Pain  . Testicle Pain    HPI Perry Reilly is a 37 y.o. male.  HPI   Pt is a 37 y/o male who presents to the ED today with multiple complaints.   Pt states he fell 4 days ago after slipping and falling down his porch steps. States he fell onto his buttocks and lower back. No head trauma or loc. Has pain to lower back.  Pain constant and worse with movement and palpation.  Pain rated as severe.  No numbness or tingling or weakness to the legs.  No urinary retention.  No loss control of bowel or bladder function.  Has been able to [facility]  Patient also complaining of right testicular pain that has been present for the last 3 days but seem to worsen yesterday.  Denies redness swelling or warmth to the scrotum.  No fevers or chills.  Reports a tingling sensation when urinating but denies dysuria frequency urgency or hematuria.  Reports recent unprotected intercourse with 2 male partners.  Past Medical History:  Diagnosis Date  . Generalized skin cysts     Patient Active Problem List   Diagnosis Date Noted  . Hidradenitis suppurativa of right axilla 05/29/2013    Past Surgical History:  Procedure Laterality Date  . HAND TENDON SURGERY          Home Medications    Prior to Admission medications   Medication Sig Start Date End Date Taking? Authorizing Provider  cyclobenzaprine (FLEXERIL) 10 MG tablet Take 0.5 tablets (5 mg total) by mouth 2 (two) times daily as needed for up to 5 days for muscle spasms. You may take 1/2 tablet up to three times daily as needed. Do not drive, operate machinery, or work while taking this medication. 08/24/18 08/29/18  Laquida Cotrell S, PA-C  oxyCODONE-acetaminophen (PERCOCET/ROXICET) 5-325 MG tablet Take 1 tablet by mouth every 8 (eight)  hours as needed for severe pain. 01/20/18   Dietrich Pates, PA-C    Family History Family History  Problem Relation Age of Onset  . Hypertension Mother     Social History Social History   Tobacco Use  . Smoking status: Current Some Day Smoker  . Smokeless tobacco: Never Used  Substance Use Topics  . Alcohol use: Yes    Comment: occasional   . Drug use: No     Allergies   Patient has no known allergies.   Review of Systems Review of Systems  Constitutional: Negative for fever.  HENT: Negative for sore throat.   Eyes: Negative for visual disturbance.  Respiratory: Negative for shortness of breath.   Cardiovascular: Negative for chest pain.  Gastrointestinal: Negative for abdominal pain, nausea and vomiting.  Genitourinary: Positive for testicular pain. Negative for discharge, dysuria, flank pain, frequency, hematuria, penile swelling and scrotal swelling.  Musculoskeletal: Positive for back pain. Negative for neck pain.  Skin: Positive for wound.  Neurological: Negative for weakness and numbness.  All other systems reviewed and are negative.    Physical Exam Updated Vital Signs BP 118/63 (BP Location: Left Arm)   Pulse 77   Temp 98.2 F (36.8 C) (Oral)   Resp 16   Wt 65.8 kg   SpO2 100%   BMI 19.67 kg/m   Physical Exam  Constitutional: He appears  well-developed and well-nourished.  HENT:  Head: Normocephalic and atraumatic.  Eyes: Conjunctivae are normal.  Neck: Neck supple.  Cardiovascular: Normal rate, regular rhythm and normal heart sounds.  Pulmonary/Chest: Effort normal and breath sounds normal. No respiratory distress. He has no wheezes.  Abdominal: Soft. Bowel sounds are normal. There is no tenderness.  No CVA TTP  Genitourinary:  Genitourinary Comments: Chaperone present during genitourinary exam.  Uncircumcised penis that appears normal with no lesions or purulent discharge.  No erythema, swelling, fluctuance or induration to the scrotum, right  testicle is slightly tender to palpation.  No masses palpated. Gc/chlamydia swab obtained.  Musculoskeletal:  Mild midline tenderness to the lower thoracic and upper lumbar spine with associated right-sided paraspinous muscle tenderness in both of these areas.  There is also an abrasion overlying this area.  5/5 strength to bilateral lower extremities.  Normal sensation throughout.  Ambulatory without difficulty.  Neurological: He is alert.  Skin: Skin is warm and dry.  Psychiatric: He has a normal mood and affect.  Nursing note and vitals reviewed.  ED Treatments / Results  Labs (all labs ordered are listed, but only abnormal results are displayed) Labs Reviewed  URINALYSIS, ROUTINE W REFLEX MICROSCOPIC - Abnormal; Notable for the following components:      Result Value   APPearance HAZY (*)    Leukocytes, UA MODERATE (*)    WBC, UA >50 (*)    All other components within normal limits  GC/CHLAMYDIA PROBE AMP ([place]) NOT AT [facility]    EKG None  Radiology Dg Thoracic Spine 2 View  Result Date: 08/24/2018 CLINICAL DATA:  Upper and lower back pain after fall EXAM: THORACIC SPINE 2 VIEWS COMPARISON:  Chest x-ray 03/10/2008 FINDINGS: There is no evidence of thoracic spine fracture. Alignment is normal. No other significant bone abnormalities are identified. IMPRESSION: Negative. Electronically Signed   By: Jasmine Pang M.D.   On: 08/24/2018 19:11   Dg Lumbar Spine Complete  Result Date: 08/24/2018 CLINICAL DATA:  Back pain after fall EXAM: LUMBAR SPINE - COMPLETE 4+ VIEW COMPARISON:  None. FINDINGS: There is no evidence of lumbar spine fracture. Alignment is normal. Intervertebral disc spaces are maintained. Mild aortic atherosclerosis. IMPRESSION: No acute osseous abnormality. Electronically Signed   By: Jasmine Pang M.D.   On: 08/24/2018 19:11   US Scrotum W/doppler  Result Date: 08/24/2018 CLINICAL DATA:  Right testicular and back pain since Monday. EXAM: SCROTAL ULTRASOUND  DOPPLER ULTRASOUND OF THE TESTICLES TECHNIQUE: Complete ultrasound examination of the testicles, epididymis, and other scrotal structures was performed. Color and spectral Doppler ultrasound were also utilized to evaluate blood flow to the testicles. COMPARISON:  None. FINDINGS: Right testicle Measurements: 4.8 x 2.9 x 2.8 cm. No mass or microlithiasis visualized. Left testicle Measurements: 4.6 x 2.3 x 3.3 cm. No mass or microlithiasis visualized. Right epididymis:  Normal in size and appearance. Left epididymis:  Normal in size and appearance. Hydrocele:  Small bilateral hydrocele. Varicocele:  Mild right varicocele. Pulsed Doppler interrogation of both testes demonstrates normal low resistance arterial and venous waveforms bilaterally. IMPRESSION: Normal appearance of the testicles. Small bilateral hydrocele. Mild right varicocele. Electronically Signed   By: Ted Mcalpine M.D.   On: 08/24/2018 19:17    Procedures Procedures (including critical care time)  Medications Ordered in ED Medications  cefTRIAXone (ROCEPHIN) injection 250 mg (250 mg Intramuscular Given 08/24/18 1907)  azithromycin (ZITHROMAX) tablet 1,000 mg (1,000 mg Oral Given 08/24/18 1906)  lidocaine (XYLOCAINE) 2 % (with pres) injection (30  mg  Given 08/24/18 1907)     Initial Impression / Assessment and Plan / ED Course  I have reviewed the triage vital signs and the nursing notes.  Pertinent labs & imaging results that were available during my care of the patient were reviewed by me and considered in my medical decision making (see chart for details).    Final Clinical Impressions(s) / ED Diagnoses   Final diagnoses:  Fall, initial encounter  Muscle pain  Hydrocele, unspecified hydrocele type  Varicocele   Patient presented with multiple complaints.  Mechanical fall 4 days ago on the stairs and landed on his back.  Had midline tenderness to thoracic and lumbar spine.  X-rays of these areas are negative.  Had  overlying abrasion to the area no signs of infection.  Wound care discussed.  No CVA tenderness bilaterally.  No abdominal pain.  No head trauma or LOC.  Advised Tylenol, Motrin and Flexeril for symptoms.  Precautions for Flexeril discussed.  Return precautions discussed.  Also complaining of tingling sensation with urination and right testicular pain.  Has mild right testicular pain on exam, no tenderness to the epididymis.  Recent unprotected sex.  UA with leukocytes, white blood cells but no bacteria.  Sterile pyuria suggests STD.  Will treat prophylactically.  GC chlamydia obtained and patient advised that results will return in the next 3 days.  Scrotal ultrasound without evidence of torsion or epididymitis.  Did show bilateral hydroceles and right-sided varicocele which could explain patient's pain.  He will be given urology follow-up in regards to this finding.  Return precautions discussed.  Patient voiced understanding of plan for follow-up and reasons to return.  All questions were answered.  Patient stable for discharge.  ED Discharge Orders         Ordered    cyclobenzaprine (FLEXERIL) 10 MG tablet  2 times daily PRN     08/24/18 1949           Karrie Meres, PA-C 08/24/18 1952    Alvira Monday, MD 08/27/18 1206

## 2018-08-25 LAB — GC/CHLAMYDIA PROBE AMP (~~LOC~~) NOT AT ARMC
CHLAMYDIA, DNA PROBE: NEGATIVE
Neisseria Gonorrhea: NEGATIVE

## 2018-12-22 ENCOUNTER — Emergency Department (HOSPITAL_COMMUNITY)
Admission: EM | Admit: 2018-12-22 | Discharge: 2018-12-22 | Disposition: A | Discharge status: Home / Self Care | Payer: Self-pay | Attending: Emergency Medicine | Admitting: Emergency Medicine

## 2018-12-22 ENCOUNTER — Encounter (HOSPITAL_COMMUNITY): Payer: Self-pay | Admitting: *Deleted

## 2018-12-22 ENCOUNTER — Other Ambulatory Visit: Payer: Self-pay

## 2018-12-22 DIAGNOSIS — F17200 Nicotine dependence, unspecified, uncomplicated: Secondary | ICD-10-CM | POA: Insufficient documentation

## 2018-12-22 DIAGNOSIS — L02411 Cutaneous abscess of right axilla: Secondary | ICD-10-CM | POA: Insufficient documentation

## 2018-12-22 MED ORDER — LIDOCAINE-EPINEPHRINE (PF) 2 %-1:200000 IJ SOLN
10.0000 mL | Freq: Once | INTRAMUSCULAR | Status: AC
  Administered 2018-12-22: 10 mL
  Filled 2018-12-22: qty 20

## 2018-12-22 NOTE — ED Provider Notes (Signed)
[facility]-EMERGENCY DEPT Provider Note   CSN: [id number] Arrival date & time: 12/22/18  3329     History   Chief Complaint Chief Complaint  Patient presents with  . Abscess    HPI Perry Reilly is a 38 y.o. male.  The history is provided by the patient.  Abscess  Location:  Shoulder/arm Shoulder/arm abscess location:  R axilla Size:  3x4cm Abscess quality: draining, fluctuance, induration and painful   Red streaking: no   Duration:  5 days Progression:  Worsening Pain details:    Quality:  Sharp, tightness, throbbing and shooting   Severity:  Severe   Duration:  5 days   Timing:  Constant   Progression:  Worsening Chronicity:  Recurrent Context comment:  Hydroadenitis suppurativa Relieved by:  Nothing Exacerbated by: palpation. Ineffective treatments:  Warm compresses Associated symptoms: no anorexia, no fatigue and no fever   Risk factors: prior abscess     Past Medical History:  Diagnosis Date  . Generalized skin cysts     Patient Active Problem List   Diagnosis Date Noted  . Hidradenitis suppurativa of right axilla 05/29/2013    Past Surgical History:  Procedure Laterality Date  . HAND TENDON SURGERY          Home Medications    Prior to Admission medications   Medication Sig Start Date End Date Taking? Authorizing Provider  oxyCODONE-acetaminophen (PERCOCET/ROXICET) 5-325 MG tablet Take 1 tablet by mouth every 8 (eight) hours as needed for severe pain. 01/20/18   Dietrich Pates, PA-C    Family History Family History  Problem Relation Age of Onset  . Hypertension Mother     Social History Social History   Tobacco Use  . Smoking status: Current Some Day Smoker  . Smokeless tobacco: Never Used  Substance Use Topics  . Alcohol use: Yes    Comment: occasional   . Drug use: No     Allergies   Patient has no known allergies.   Review of Systems Review of Systems  Constitutional: Negative for fatigue and  fever.  Gastrointestinal: Negative for anorexia.  All other systems reviewed and are negative.    Physical Exam Updated Vital Signs BP (!) 138/99   Pulse 69   Temp 98.2 F (36.8 C) (Oral)   Resp 14   Ht 6' (1.829 m)   Wt 67.1 kg   SpO2 100%   BMI 20.07 kg/m   Physical Exam Vitals signs and nursing note reviewed.  Constitutional:      Appearance: Normal appearance. He is normal weight.  HENT:     Head: Normocephalic and atraumatic.     Nose: Nose normal.  Cardiovascular:     Rate and Rhythm: Normal rate.  Pulmonary:     Effort: Pulmonary effort is normal.  Musculoskeletal: Normal range of motion.        General: No swelling or tenderness.  Skin:    General: Skin is warm and dry.     Comments: Abscess in the right axilla that is indurated/fluctuant and pointing.  Neurological:     General: No focal deficit present.     Mental Status: He is alert. Mental status is at baseline.  Psychiatric:        Mood and Affect: Mood normal.      ED Treatments / Results  Labs (all labs ordered are listed, but only abnormal results are displayed) Labs Reviewed - No data to display  EKG None  Radiology No results found.  Procedures Procedures (including critical care time) INCISION AND DRAINAGE Performed by: Gwyneth Sprout Consent: Verbal consent obtained. Risks and benefits: risks, benefits and alternatives were discussed Type: abscess  Body area: right axilla  Anesthesia: local infiltration  Incision was made with a scalpel.  Local anesthetic: lidocaine 2% with epinephrine  Anesthetic total: 5 ml  Complexity: complex Blunt dissection to break up loculations  Drainage: purulent  Drainage amount: 16mL  Packing material: 1/4 in iodoform gauze  Patient tolerance: Patient tolerated the procedure well with no immediate complications.     Medications Ordered in ED Medications  lidocaine-EPINEPHrine (XYLOCAINE W/EPI) 2 %-1:200000 (PF) injection 10 mL  (has no administration in time range)     Initial Impression / Assessment and Plan / ED Course  I have reviewed the triage vital signs and the nursing notes.  Pertinent labs & imaging results that were available during my care of the patient were reviewed by me and considered in my medical decision making (see chart for details).     Patient presenting with an abscess of the right axilla.  I&D as above.  No complications or surrounding cellulitis that would require antibiotics at this time.  Patient will do warm soaks and remove packing in 2 to 3 days.  Final Clinical Impressions(s) / ED Diagnoses   Final diagnoses:  Abscess of axilla, right    ED Discharge Orders    None       Gwyneth Sprout, MD 12/22/18 [phone]

## 2018-12-22 NOTE — ED Notes (Signed)
Bed: WA24 Expected date:  Expected time:  Means of arrival:  Comments: 

## 2018-12-22 NOTE — Discharge Instructions (Signed)
Make sure you were eating taking hot showers and letting water run over the area or that you are using warm compresses several times a day.  Remove packing in 2 to 3 days if still present

## 2018-12-22 NOTE — ED Notes (Signed)
ED Provider at bedside. 

## 2018-12-22 NOTE — ED Notes (Addendum)
Incision and Drainage Tray is placed at bedside for MD.

## 2019-01-28 ENCOUNTER — Other Ambulatory Visit: Payer: Self-pay

## 2019-01-28 ENCOUNTER — Emergency Department (HOSPITAL_COMMUNITY): Payer: Self-pay

## 2019-01-28 ENCOUNTER — Encounter (HOSPITAL_COMMUNITY): Payer: Self-pay

## 2019-01-28 ENCOUNTER — Emergency Department (HOSPITAL_COMMUNITY)
Admission: EM | Admit: 2019-01-28 | Discharge: 2019-01-28 | Disposition: A | Discharge status: Home / Self Care | Payer: Self-pay | Attending: Emergency Medicine | Admitting: Emergency Medicine

## 2019-01-28 DIAGNOSIS — Z23 Encounter for immunization: Secondary | ICD-10-CM | POA: Insufficient documentation

## 2019-01-28 DIAGNOSIS — Y999 Unspecified external cause status: Secondary | ICD-10-CM | POA: Insufficient documentation

## 2019-01-28 DIAGNOSIS — F17210 Nicotine dependence, cigarettes, uncomplicated: Secondary | ICD-10-CM | POA: Insufficient documentation

## 2019-01-28 DIAGNOSIS — W269XXA Contact with unspecified sharp object(s), initial encounter: Secondary | ICD-10-CM | POA: Insufficient documentation

## 2019-01-28 DIAGNOSIS — Y939 Activity, unspecified: Secondary | ICD-10-CM | POA: Insufficient documentation

## 2019-01-28 DIAGNOSIS — Y92481 Parking lot as the place of occurrence of the external cause: Secondary | ICD-10-CM | POA: Insufficient documentation

## 2019-01-28 DIAGNOSIS — W0110XA Fall on same level from slipping, tripping and stumbling with subsequent striking against unspecified object, initial encounter: Secondary | ICD-10-CM | POA: Insufficient documentation

## 2019-01-28 DIAGNOSIS — S61411A Laceration without foreign body of right hand, initial encounter: Secondary | ICD-10-CM | POA: Insufficient documentation

## 2019-01-28 MED ORDER — TETANUS-DIPHTH-ACELL PERTUSSIS 5-2.5-18.5 LF-MCG/0.5 IM SUSP
0.5000 mL | Freq: Once | INTRAMUSCULAR | Status: AC
  Administered 2019-01-28: 0.5 mL via INTRAMUSCULAR
  Filled 2019-01-28: qty 0.5

## 2019-01-28 MED ORDER — LIDOCAINE HCL (PF) 1 % IJ SOLN
10.0000 mL | Freq: Once | INTRAMUSCULAR | Status: AC
  Administered 2019-01-28: 10 mL via INTRADERMAL
  Filled 2019-01-28: qty 30

## 2019-01-28 NOTE — ED Triage Notes (Signed)
Patient coming from home with complaints of a fall and laceration to the right hand. Patient fell around 7 PM, EMS was called, but patient refused treatment. Patient was intoxicated and lost his balance. Did not lose consciousness or hit head when falling.
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
## 2019-02-07 ENCOUNTER — Emergency Department (HOSPITAL_COMMUNITY)
Admission: EM | Admit: 2019-02-07 | Discharge: 2019-02-07 | Disposition: A | Discharge status: Home / Self Care | Payer: Self-pay | Attending: Emergency Medicine | Admitting: Emergency Medicine

## 2019-02-07 ENCOUNTER — Other Ambulatory Visit: Payer: Self-pay

## 2019-02-07 ENCOUNTER — Encounter (HOSPITAL_COMMUNITY): Payer: Self-pay | Admitting: Emergency Medicine

## 2019-02-07 DIAGNOSIS — Z4802 Encounter for removal of sutures: Secondary | ICD-10-CM | POA: Insufficient documentation

## 2019-02-07 NOTE — ED Triage Notes (Signed)
Pt reports that got stitches in right hand on 3/22 and was told to come back today to get them removed. Denies s/s of infection.
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
## 2019-02-07 NOTE — ED Notes (Signed)
Bed: [facility] Expected date:  Expected time:  Means of arrival:  Comments: 

## 2019-02-07 NOTE — Discharge Instructions (Addendum)
I have removed 9 sutures from your right hand.  You may apply Neosporin, bacitracin to the area.  Keep this wound clean, and dry.

## 2021-09-16 ENCOUNTER — Encounter (HOSPITAL_COMMUNITY): Payer: Self-pay | Admitting: Emergency Medicine

## 2021-09-16 ENCOUNTER — Other Ambulatory Visit: Payer: Self-pay

## 2021-09-16 ENCOUNTER — Ambulatory Visit (HOSPITAL_COMMUNITY)
Admission: EM | Admit: 2021-09-16 | Discharge: 2021-09-16 | Disposition: A | Discharge status: Home / Self Care | Payer: 59 | Attending: Medical Oncology | Admitting: Medical Oncology

## 2021-09-16 DIAGNOSIS — L723 Sebaceous cyst: Secondary | ICD-10-CM

## 2021-09-16 DIAGNOSIS — L732 Hidradenitis suppurativa: Secondary | ICD-10-CM | POA: Diagnosis not present

## 2021-09-16 MED ORDER — DOXYCYCLINE HYCLATE 100 MG PO CAPS: 100.0000 mg | ORAL_CAPSULE | Freq: Two times a day (BID) | ORAL | 0 refills | Status: DC

## 2021-09-16 MED ORDER — IBUPROFEN 800 MG PO TABS: 800.0000 mg | ORAL_TABLET | Freq: Three times a day (TID) | ORAL | 0 refills | Status: AC

## 2021-09-16 NOTE — ED Triage Notes (Signed)
Pt has abscess behind left ear for a month. [facility]en [facility] about it and then referred to ENT. ENT doesn't take insurance. Pt reprots that's painful. Denies drainage.

## 2021-09-16 NOTE — ED Provider Notes (Signed)
[facility]    CSN: [id number] Arrival date & time: 09/16/21  0807      History   Chief Complaint Chief Complaint  Patient presents with   Abscess    HPI BARUCH LEWERS is a 40 y.o. male.   HPI  Abscess: Pt reports that he was diagnosed with a cyst like lesion behind his left ear. He has had this for about 1 month. He was referred to ENT by another urgent care but patient reports that they do not take his insurance so he has not attended a visit. He reports that recently over the past few days the area has become more painful. No fever, drainage or history of MRSA. It has not drained yet but has in the past. He also reports a history of hidradenitis of other areas of his body. He has tried warm compress for symptoms without much relief. He a visit with a new ENT in January but he is unsure if they take his insurance.    Past Medical History:  Diagnosis Date   Generalized skin cysts     Patient Active Problem List   Diagnosis Date Noted   Hidradenitis suppurativa of right axilla 05/29/2013    Past Surgical History:  Procedure Laterality Date   HAND TENDON SURGERY         Home Medications    Prior to Admission medications   Medication Sig Start Date End Date Taking? Authorizing Provider  ibuprofen (ADVIL,MOTRIN) 200 MG tablet Take 400 mg by mouth every 6 (six) hours as needed for headache or moderate pain.    [provider]    Family History Family History  Problem Relation Age of Onset   Hypertension Mother     Social History Social History   Tobacco Use   Smoking status: Some Days   Smokeless tobacco: Never  Substance Use Topics   Alcohol use: Yes    Comment: occasional    Drug use: No     Allergies   Patient has no known allergies.   Review of Systems Review of Systems  As stated above in HPI Physical Exam Triage Vital Signs ED Triage Vitals  Enc Vitals Group     BP 09/16/21 0847 (!) 150/70     Pulse Rate 09/16/21  0847 (!) 59     Resp 09/16/21 0847 16     Temp 09/16/21 0847 97.9 F (36.6 C)     Temp Source 09/16/21 0847 Oral     SpO2 09/16/21 0847 99 %     Weight --      Height --      Head Circumference --      Peak Flow --      Pain Score 09/16/21 0846 8     Pain Loc --      Pain Edu? --      Excl. in GC? --    No data found.  Updated Vital Signs BP (!) 150/70 (BP Location: Right Arm)   Pulse (!) 59   Temp 97.9 F (36.6 C) (Oral)   Resp 16   SpO2 99%   Physical Exam Vitals and nursing note reviewed.  Constitutional:      General: He is not in acute distress.    Appearance: Normal appearance. He is not ill-appearing, toxic-appearing or diaphoretic.  HENT:     Head: Normocephalic and atraumatic.  Lymphadenopathy:     Cervical: Cervical adenopathy (left sided) present.  Skin:    Comments: There  is a pea sized cyst posterior to the left auricle without drainage or fluctuance but with mild to moderate tenderness and erythema. No drainage.   Neurological:     Mental Status: He is alert.     UC Treatments / Results  Labs (all labs ordered are listed, but only abnormal results are displayed) Labs Reviewed - No data to display  EKG   Radiology No results found.  Procedures Procedures (including critical care time)  Medications Ordered in UC Medications - No data to display  Initial Impression / Assessment and Plan / UC Course  I have reviewed the triage vital signs and the nursing notes.  Pertinent labs & imaging results that were available during my care of the patient were reviewed by me and considered in my medical decision making (see chart for details).     New. Appears to be a flare of hidradenitis vs inflammed cyst. Given the location and his history I do not feel that at this time I&D is in his best interest. Instead I would recommend warm compress, ibuprofen, and doxycyline. Discussed red flag signs and symptoms. ENT, dermatology or general surgery follow up  for formal removal.  Final Clinical Impressions(s) / UC Diagnoses   Final diagnoses:  None   Discharge Instructions   None    ED Prescriptions   None    PDMP not reviewed this encounter.   Rushie Chestnut, [place] 09/16/21 [phone]

## 2021-10-21 ENCOUNTER — Other Ambulatory Visit: Payer: Self-pay | Admitting: Otolaryngology

## 2021-10-21 DIAGNOSIS — R221 Localized swelling, mass and lump, neck: Secondary | ICD-10-CM

## 2021-11-12 ENCOUNTER — Ambulatory Visit: Payer: 59

## 2021-12-01 ENCOUNTER — Ambulatory Visit: Admission: RE | Admit: 2021-12-01 | Payer: 59 | Source: Ambulatory Visit

## 2021-12-21 ENCOUNTER — Other Ambulatory Visit: Payer: Self-pay

## 2021-12-21 ENCOUNTER — Ambulatory Visit
Admission: RE | Admit: 2021-12-21 | Discharge: 2021-12-21 | Disposition: A | Discharge status: Home / Self Care | Payer: 59 | Source: Ambulatory Visit | Attending: Otolaryngology | Admitting: Otolaryngology

## 2021-12-21 DIAGNOSIS — R221 Localized swelling, mass and lump, neck: Secondary | ICD-10-CM | POA: Insufficient documentation

## 2021-12-21 MED ORDER — IOHEXOL 300 MG/ML  SOLN
75.0000 mL | Freq: Once | INTRAMUSCULAR | Status: AC | PRN
  Administered 2021-12-21: 75 mL via INTRAVENOUS

## 2022-03-21 ENCOUNTER — Other Ambulatory Visit: Payer: Self-pay

## 2022-03-21 ENCOUNTER — Emergency Department (HOSPITAL_COMMUNITY): Payer: 59

## 2022-03-21 ENCOUNTER — Emergency Department (HOSPITAL_COMMUNITY)
Admission: EM | Admit: 2022-03-21 | Discharge: 2022-03-21 | Disposition: A | Discharge status: Home / Self Care | Payer: 59 | Attending: Emergency Medicine | Admitting: Emergency Medicine

## 2022-03-21 ENCOUNTER — Encounter (HOSPITAL_COMMUNITY): Payer: Self-pay

## 2022-03-21 DIAGNOSIS — S6991XA Unspecified injury of right wrist, hand and finger(s), initial encounter: Secondary | ICD-10-CM | POA: Diagnosis present

## 2022-03-21 DIAGNOSIS — W1830XA Fall on same level, unspecified, initial encounter: Secondary | ICD-10-CM | POA: Insufficient documentation

## 2022-03-21 DIAGNOSIS — S62610B Displaced fracture of proximal phalanx of right index finger, initial encounter for open fracture: Secondary | ICD-10-CM | POA: Insufficient documentation

## 2022-03-21 MED ORDER — LIDOCAINE HCL (PF) 1 % IJ SOLN
30.0000 mL | Freq: Once | INTRAMUSCULAR | Status: AC
  Administered 2022-03-21: 30 mL
  Filled 2022-03-21: qty 30

## 2022-03-21 MED ORDER — CEPHALEXIN 500 MG PO CAPS
500.0000 mg | ORAL_CAPSULE | Freq: Once | ORAL | Status: AC
  Administered 2022-03-21: 500 mg via ORAL
  Filled 2022-03-21: qty 1

## 2022-03-21 MED ORDER — CEPHALEXIN 500 MG PO CAPS: 500.0000 mg | ORAL_CAPSULE | Freq: Two times a day (BID) | ORAL | 0 refills | Status: DC

## 2022-03-21 NOTE — ED Provider Notes (Signed)
?[facility]ER[facility]Y DEPT ?[facility] Emergency Department ?Provider Note ?MRN:  [id number]  ?Arrival date & time: 03/21/22    ? ?Chief Complaint   ?Hand Injury ?  ?History of Present Illness   ?Perry Reilly is a 41 y.o. year-old male presents to the ED with chief complaint of fall and finger laceration.  Patient doesn't elaborate on events leading up to the fall.  Paint is worsened with palpation and movement.  Bleeding is controlled with gauze. ? ?History provided by patient. ? ? ?Review of Systems  ?Pertinent review of systems noted in HPI.  ? ? ?Physical Exam  ? ?Vitals:  ? 03/21/22 0108 03/21/22 0406  ?BP: (!) 164/118 (!) 157/98  ?Pulse: 99 95  ?Resp: 16 20  ?Temp: 98.2 ?F (36.8 ?C) 98.1 ?F (36.7 ?C)  ?SpO2: 93% 100%  ?  ?CONSTITUTIONAL:  well-appearing, NAD ?NEURO:  Alert and oriented x 3, CN 3-12 grossly intact ?EYES:  eyes equal and reactive ?ENT/NECK:  Supple, no stridor  ?CARDIO:  appears well-perfused  ?PULM:  No respiratory distress,  ?GI/GU:  non-distended,  ?MSK/SPINE:  open fracture to right index finger with 1 cm laceration to the posterior aspect of the finger ?SKIN:  no rash, atraumatic ? ? ?*Additional and/or pertinent findings included in MDM below ? ?Diagnostic and Interventional Summary  ? ? EKG Interpretation ? ?Date/Time:    ?Ventricular Rate:    ?PR Interval:    ?QRS Duration:   ?QT Interval:    ?QTC Calculation:   ?R Axis:     ?Text Interpretation:   ?  ? ?  ? ?Labs Reviewed - No data to display  ?DG Hand Complete Right  ?Final Result  ?  ?DG Hand Complete Right  ?Final Result  ?  ?  ?Medications  ?lidocaine (PF) (XYLOCAINE) 1 % injection 30 mL (30 mLs Infiltration Given by Other 03/21/22 0330)  ?cephALEXin (KEFLEX) capsule 500 mg (500 mg Oral Given 03/21/22 0404)  ?  ? ?Procedures  /  Critical Care ?Marland Kitchen.Laceration Repair ? ?Date/Time: 03/21/2022 4:21 AM ?Performed by: Roxy Horseman, PA-C ?Authorized by: Roxy Horseman, PA-C  ? ?Consent:  ?  Consent obtained:  Verbal ?  Consent given by:   Patient ?  Risks discussed:  Infection, need for additional repair, pain, poor cosmetic result and poor wound healing ?  Alternatives discussed:  No treatment and delayed treatment ?Universal protocol:  ?  Procedure explained and questions answered to patient or proxy's satisfaction: yes   ?  Relevant documents present and verified: yes   ?  Test results available: yes   ?  Imaging studies available: yes   ?  Required blood products, implants, devices, and special equipment available: yes   ?  Site/side marked: yes   ?  Immediately prior to procedure, a time out was called: yes   ?  Patient identity confirmed:  Verbally with patient ?Anesthesia:  ?  Anesthesia method:  Nerve block ?  Block location:  Right index finger ?  Block needle gauge:  27 G ?  Block technique:  Ring block ?  Block injection procedure:  Anatomic landmarks identified, anatomic landmarks palpated, introduced needle, incremental injection and negative aspiration for blood ?  Block outcome:  Anesthesia achieved ?Laceration details:  ?  Location:  Finger ?  Finger location:  R index finger ?  Length (cm):  1 ?Pre-procedure details:  ?  Preparation:  Patient was prepped and draped in usual sterile fashion ?Exploration:  ?  Imaging obtained: x-ray   ?  Wound exploration: wound explored through full range of motion and entire depth of wound visualized   ?  Wound extent: underlying fracture   ?  Contaminated: no   ?Treatment:  ?  Area cleansed with:  Saline and povidone-iodine ?  Amount of cleaning:  Standard ?  Irrigation solution:  Sterile saline ?Skin repair:  ?  Repair method:  Sutures ?  Suture size:  4-0 ?  Suture material:  Prolene ?  Suture technique:  Simple interrupted ?  Number of sutures:  3 ?Approximation:  ?  Approximation:  Close ?Repair type:  ?  Repair type:  Simple ?Post-procedure details:  ?  Dressing:  Non-adherent dressing and splint for protection ?  Procedure completion:  Tolerated well, no immediate complications ?Reduction of  fracture ? ?Date/Time: 03/21/2022 4:23 AM ?Performed by: Roxy HorsemanBrowning, Barri Neidlinger, PA-C ?Authorized by: Roxy HorsemanBrowning, Garrick Midgley, PA-C  ?Consent: Verbal consent obtained. ?Risks and benefits: risks, benefits and alternatives were discussed ?Consent given by: patient ?Patient understanding: patient states understanding of the procedure being performed ?Patient consent: the patient's understanding of the procedure matches consent given ?Procedure consent: procedure consent matches procedure scheduled ?Relevant documents: relevant documents present and verified ?Test results: test results available and properly labeled ?Site marked: the operative site was marked ?Imaging studies: imaging studies available ?Required items: required blood products, implants, devices, and special equipment available ?Patient identity confirmed: verbally with patient ?Time out: Immediately prior to procedure a "time out" was called to verify the correct patient, procedure, equipment, support staff and site/side marked as required. ?Local anesthesia used: yes ?Anesthesia: digital block ? ?Anesthesia: ?Local anesthesia used: yes ?Local Anesthetic: lidocaine 1% without epinephrine ?Anesthetic total: 5 mL ? ?Sedation: ?Patient sedated: no ? ?Patient tolerance: patient tolerated the procedure well with no immediate complications ?Comments: Successful reduction of right index finger fx ? ? ? ?ED Course and Medical Decision Making  ?I have reviewed the triage vital signs, the nursing notes, and pertinent available records from the EMR. ? ?Social Determinants Affecting Complexity of Care: ?Patient has no clinically significant social determinants affecting this chief complaint.. ? ? ?ED Course: ?  ?Patient here with finger injury.  Top differential diagnoses include laceration fracture, dislocation. ?Medical Decision Making ?Problems Addressed: ?Open displaced fracture of proximal phalanx of right index finger, initial encounter: acute illness or injury ?    Details: Open fx reduced by me and lac repaired by me.  Soaked and irrigated.  Started on abx.  Consult with hand surgery. ? ?Amount and/or Complexity of Data Reviewed ?Radiology: ordered and independent interpretation performed. ?   Details: fx of right index finger, displaced, fusion of PIP ?Post reduction film shows good anatomic alignment of fracture fragments ? ?Risk ?Prescription drug management. ? ?  ? ?Consultants: ?I discussed the case with Dr. Izora Ribasoley, who recommends reduction and skin closure.  Will start abx.  Patient to follow-up with Dr. Izora Ribasoley in the office.. ? ? ?Treatment and Plan: ? ? ?Emergency department workup does not suggest an emergent condition requiring admission or immediate intervention beyond  what has been performed at this time. The patient is safe for discharge and has  been instructed to return immediately for worsening symptoms, change in  symptoms or any other concerns ? ? ? ?Final Clinical Impressions(s) / ED Diagnoses  ? ?  ICD-10-CM   ?1. Open displaced fracture of proximal phalanx of right index finger, initial encounter  S62.610B   ?  ?  ?ED Discharge Orders   ? ?  Ordered  ?  cephALEXin (KEFLEX) 500 MG capsule  2 times daily       ? 03/21/22 0358  ? ?  ?  ? ?  ?  ? ? ?Discharge Instructions Discussed with and Provided to Patient:  ? ? ? ?Discharge Instructions   ? ?  ?Take antibiotics as prescribed. ? ?Keep the finger in the splint. ? ?Keep the splint clean and dry. ? ?Take Tylenol or Motrin for pain. ? ? ? ? ?  ?Roxy Horseman, PA-C ?03/21/22 0425 ? ?  ?Tilden Fossa, MD ?03/21/22 0542 ? ?

## 2022-03-21 NOTE — Discharge Instructions (Signed)
Take antibiotics as prescribed. ? ?Keep the finger in the splint. ? ?Keep the splint clean and dry. ? ?Take Tylenol or Motrin for pain. ?

## 2022-03-21 NOTE — ED Triage Notes (Signed)
Pt fell on his right hand causing a laceration to his first 2 fingers. Pt states that he had a pin in his right index finger prior to this injury and is concerned that he messed it up.  ?

## 2022-03-30 ENCOUNTER — Ambulatory Visit (INDEPENDENT_AMBULATORY_CARE_PROVIDER_SITE_OTHER): Payer: 59

## 2022-03-30 ENCOUNTER — Encounter: Payer: Self-pay | Admitting: Orthopedic Surgery

## 2022-03-30 ENCOUNTER — Ambulatory Visit: Payer: 59 | Admitting: Orthopedic Surgery

## 2022-03-30 VITALS — BP 145/91 | HR 66 | Ht 72.0 in | Wt 150.0 lb

## 2022-03-30 DIAGNOSIS — S62610A Displaced fracture of proximal phalanx of right index finger, initial encounter for closed fracture: Secondary | ICD-10-CM

## 2022-03-30 DIAGNOSIS — S6991XA Unspecified injury of right wrist, hand and finger(s), initial encounter: Secondary | ICD-10-CM

## 2022-03-30 HISTORY — DX: Displaced fracture of proximal phalanx of right index finger, initial encounter for closed fracture: S62.610A
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
## 2022-04-01 ENCOUNTER — Ambulatory Visit (INDEPENDENT_AMBULATORY_CARE_PROVIDER_SITE_OTHER): Payer: 59 | Admitting: Rehabilitative and Restorative Service Providers"

## 2022-04-01 ENCOUNTER — Other Ambulatory Visit: Payer: Self-pay

## 2022-04-01 ENCOUNTER — Encounter: Payer: Self-pay | Admitting: Rehabilitative and Restorative Service Providers"

## 2022-04-01 DIAGNOSIS — M79641 Pain in right hand: Secondary | ICD-10-CM | POA: Diagnosis not present

## 2022-04-01 DIAGNOSIS — R278 Other lack of coordination: Secondary | ICD-10-CM

## 2022-04-01 DIAGNOSIS — R600 Localized edema: Secondary | ICD-10-CM | POA: Diagnosis not present

## 2022-04-01 DIAGNOSIS — M25641 Stiffness of right hand, not elsewhere classified: Secondary | ICD-10-CM | POA: Diagnosis not present

## 2022-04-01 DIAGNOSIS — M6281 Muscle weakness (generalized): Secondary | ICD-10-CM
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
## 2022-04-13 ENCOUNTER — Ambulatory Visit (INDEPENDENT_AMBULATORY_CARE_PROVIDER_SITE_OTHER): Payer: 59

## 2022-04-13 ENCOUNTER — Ambulatory Visit (INDEPENDENT_AMBULATORY_CARE_PROVIDER_SITE_OTHER): Payer: 59 | Admitting: Orthopedic Surgery

## 2022-04-13 DIAGNOSIS — S6991XA Unspecified injury of right wrist, hand and finger(s), initial encounter: Secondary | ICD-10-CM | POA: Diagnosis not present

## 2022-04-13 DIAGNOSIS — S62610A Displaced fracture of proximal phalanx of right index finger, initial encounter for closed fracture: Secondary | ICD-10-CM | POA: Diagnosis not present
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
## 2022-04-16 ENCOUNTER — Encounter: Payer: 59 | Admitting: Rehabilitative and Restorative Service Providers"
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
## 2022-04-19 ENCOUNTER — Encounter: Payer: Self-pay | Admitting: Rehabilitative and Restorative Service Providers"

## 2022-04-19 ENCOUNTER — Ambulatory Visit (INDEPENDENT_AMBULATORY_CARE_PROVIDER_SITE_OTHER): Payer: 59 | Admitting: Rehabilitative and Restorative Service Providers"

## 2022-04-19 DIAGNOSIS — R278 Other lack of coordination: Secondary | ICD-10-CM | POA: Diagnosis not present

## 2022-04-19 DIAGNOSIS — M79641 Pain in right hand: Secondary | ICD-10-CM

## 2022-04-19 DIAGNOSIS — M25641 Stiffness of right hand, not elsewhere classified: Secondary | ICD-10-CM

## 2022-04-19 DIAGNOSIS — M6281 Muscle weakness (generalized): Secondary | ICD-10-CM

## 2022-04-19 DIAGNOSIS — R600 Localized edema: Secondary | ICD-10-CM
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
## 2022-04-27 ENCOUNTER — Ambulatory Visit: Payer: 59 | Admitting: Orthopedic Surgery

## 2022-04-29 ENCOUNTER — Ambulatory Visit: Payer: 59 | Admitting: Orthopedic Surgery

## 2022-05-04 ENCOUNTER — Ambulatory Visit (INDEPENDENT_AMBULATORY_CARE_PROVIDER_SITE_OTHER): Payer: 59 | Admitting: Orthopedic Surgery

## 2022-05-04 ENCOUNTER — Ambulatory Visit (INDEPENDENT_AMBULATORY_CARE_PROVIDER_SITE_OTHER): Payer: 59

## 2022-05-04 DIAGNOSIS — S6991XA Unspecified injury of right wrist, hand and finger(s), initial encounter: Secondary | ICD-10-CM | POA: Diagnosis not present

## 2022-05-04 DIAGNOSIS — S62610A Displaced fracture of proximal phalanx of right index finger, initial encounter for closed fracture: Secondary | ICD-10-CM

## 2022-05-31 ENCOUNTER — Encounter: Payer: Self-pay | Admitting: Orthopedic Surgery

## 2022-05-31 ENCOUNTER — Ambulatory Visit (INDEPENDENT_AMBULATORY_CARE_PROVIDER_SITE_OTHER): Payer: 59 | Admitting: Orthopedic Surgery

## 2022-05-31 ENCOUNTER — Ambulatory Visit (INDEPENDENT_AMBULATORY_CARE_PROVIDER_SITE_OTHER): Payer: 59

## 2022-05-31 DIAGNOSIS — S62610A Displaced fracture of proximal phalanx of right index finger, initial encounter for closed fracture: Secondary | ICD-10-CM
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
## 2022-06-19 ENCOUNTER — Encounter: Payer: Self-pay | Admitting: Emergency Medicine

## 2022-06-19 ENCOUNTER — Ambulatory Visit
Admission: EM | Admit: 2022-06-19 | Discharge: 2022-06-19 | Disposition: A | Discharge status: Home / Self Care | Payer: Commercial Managed Care - HMO | Attending: Physician Assistant | Admitting: Physician Assistant

## 2022-06-19 DIAGNOSIS — L089 Local infection of the skin and subcutaneous tissue, unspecified: Secondary | ICD-10-CM | POA: Diagnosis not present

## 2022-06-19 DIAGNOSIS — R030 Elevated blood-pressure reading, without diagnosis of hypertension: Secondary | ICD-10-CM

## 2022-06-19 DIAGNOSIS — L729 Follicular cyst of the skin and subcutaneous tissue, unspecified: Secondary | ICD-10-CM

## 2022-06-19 MED ORDER — SULFAMETHOXAZOLE-TRIMETHOPRIM 800-160 MG PO TABS: 1.0000 | ORAL_TABLET | Freq: Two times a day (BID) | ORAL | 0 refills | Status: AC

## 2022-06-19 NOTE — ED Triage Notes (Signed)
Pt is present today with a recurrent cyst on is left ear. Pt states that he noticed the cyst x2 days ago

## 2022-06-19 NOTE — Discharge Instructions (Signed)
We drained this lesion.  Use warm compresses for comfort and to encourage drainage.  Alternate Tylenol or Profen.  Take Bactrim DS for antibiotic.  If you develop any rash or lesions stop the medication to be seen.  If anything changes return for reevaluation.  Follow-up with ENT.  Your blood pressure is very elevated.  Someone to reach out to you to reschedule a primary care appointment.  Avoid NSAIDs (aspirin, ibuprofen/Advil, naproxen/Aleve) caffeine, sodium, decongestants.  Monitor your blood pressure at home.  If you develop any chest pain, shortness of breath, headache, vision change you need to go to the emergency room.

## 2022-06-19 NOTE — ED Provider Notes (Signed)
[name]    CSN: [id number] Arrival date & time: 06/19/22  0818      History   Chief Complaint Chief Complaint  Patient presents with   Cyst    Left ear     HPI Perry Reilly is a 41 y.o. male.   Patient presents today with a 2-day history of enlarging cyst posterior to his earlobe on his left ear.  Reports that this has occurred in the past but drained spontaneously.  He has seen an ENT but they do not take his insurance so he did not have it surgically removed.  It then improved for a while until recurrence a few days ago.  He reports that pain is rated 8 on a 0-10 pain scale, described as aching, no aggravating relieving factors identified.  He has not been applying any over-the-counter medication for symptom management.  Denies any recent antibiotics.  Denies history of recurrent skin infections but does have at bedtime.  Denies fever, nausea, vomiting, fatigue, malaise.  Patient blood pressure is elevated today.  Has been elevated in the past.  He denies any recent NSAID use, decongestants, increased caffeine/sodium.  He denies any chest pain, shortness of breath, headache, vision change, dizziness.  He has not been on antihypertensive medications in the past.  He does not have a primary care provider.    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 Authorizing Provider  sulfamethoxazole-trimethoprim (BACTRIM DS) 800-160 MG  tablet Take 1 tablet by mouth 2 (two) times daily for 7 days. 06/19/22 06/26/22 Yes Eyanna Mcgonagle K, PA-C  ibuprofen (ADVIL) 800 MG tablet Take 1 tablet (800 mg total) by mouth 3 (three) times daily. Patient not taking: Reported on 04/01/2022 09/16/21   Rushie Chestnut, PA-C    Family History Family History  Problem Relation Age of Onset   Hypertension Mother     Social History Social History   Tobacco Use   Smoking status: Some Days   Smokeless tobacco: Never  Substance Use Topics   Alcohol use: Yes    Comment: occasional    Drug use: No     Allergies   Patient has no known allergies.   Review of Systems Review of Systems  Constitutional:  Negative for activity change, appetite change, fatigue and fever.  Eyes:  Negative for photophobia and visual disturbance.  Respiratory:  Negative for cough and shortness of breath.   Cardiovascular:  Negative for chest pain.  Gastrointestinal:  Negative for abdominal pain, diarrhea, nausea and vomiting.  Skin:  Positive for color change and wound.  Neurological:  Negative for dizziness, light-headedness and headaches.     Physical Exam Triage Vital Signs ED Triage Vitals [06/19/22 0905]  Enc Vitals Group     BP (!) 181/96     Pulse Rate 68     Resp 18     Temp 97.8 F (36.6 C)  Temp src      SpO2 98 %     Weight      Height      Head Circumference      Peak Flow      Pain Score 8     Pain Loc      Pain Edu?      Excl. in [place]?    No data found.  Updated Vital Signs BP (!) 177/106   Pulse 68   Temp 97.8 F (36.6 C)   Resp 18   SpO2 98%   Visual Acuity Right Eye Distance:   Left Eye Distance:   Bilateral Distance:    Right Eye Near:   Left Eye Near:    Bilateral Near:     Physical Exam Vitals reviewed.  Constitutional:      General: He is awake.     Appearance: Normal appearance. He is well-developed. He is not ill-appearing.     Comments: Very pleasant male appears stated age in no acute distress  sitting comfortably in exam room  HENT:     Head: Normocephalic and atraumatic.     Ears:     Comments: 2 cm x 2 cm cystic lesion noted posterior to left earlobe.  No active bleeding or drainage noted.    Nose: Nose normal.  Cardiovascular:     Rate and Rhythm: Normal rate and regular rhythm.     Heart sounds: Normal heart sounds, S1 normal and S2 normal. No murmur heard. Pulmonary:     Effort: Pulmonary effort is normal.     Breath sounds: Normal breath sounds. No stridor. No wheezing, rhonchi or rales.     Comments: Clear to auscultation bilaterally Neurological:     Mental Status: He is alert.  Psychiatric:        Behavior: Behavior is cooperative.      UC Treatments / Results  Labs (all labs ordered are listed, but only abnormal results are displayed) Labs Reviewed - No data to display  EKG   Radiology No results found.  Procedures Incision and Drainage  Date/Time: 06/19/2022 9:47 AM  Performed by: Jeani Hawking, PA-C Authorized by: Jeani Hawking, PA-C   Consent:    Consent obtained:  Verbal   Consent given by:  Patient   Risks discussed:  Incomplete drainage and bleeding   Alternatives discussed:  Observation, alternative treatment and referral Universal protocol:    Procedure explained and questions answered to patient or proxy's satisfaction: yes     Patient identity confirmed:  Verbally with patient Location:    Type:  Cyst   Size:  2 cm x 2 cm   Location:  Head   Head location:  L external ear Pre-procedure details:    Skin preparation:  Chlorhexidine with alcohol Sedation:    Sedation type:  None Anesthesia:    Anesthesia method:  Local infiltration   Local anesthetic:  Lidocaine 1% w/o epi Procedure type:    Complexity:  Complex Procedure details:    Ultrasound guidance: no     Needle aspiration: no     Incision types:  Single straight   Incision depth:  Dermal   Wound management:  Probed and deloculated and irrigated with saline    Drainage:  Bloody and purulent   Drainage amount:  Moderate   Wound treatment:  Wound left open   Packing materials:  None Post-procedure details:    Procedure completion:  Tolerated  (including critical care time)  Medications Ordered  in UC Medications - No data to display  Initial Impression / Assessment and Plan / UC Course  I have reviewed the triage vital signs and the nursing notes.  Pertinent labs & imaging results that were available during my care of the patient were reviewed by me and considered in my medical decision making (see chart for details).     Cyst was successfully drained in clinic.  Patient was started on Bactrim DS.  He was encouraged to use warm compresses to encourage drainage.  Recommended he follow-up with an ENT to consider removal of cyst as this will likely recur until cyst is addressed.  He was given contact information for local provider with instruction to call to schedule an appointment.  Recommended that he use Tylenol ibuprofen for pain.  If he develops any enlarging lesion, fever, nausea, vomiting, change in character of drainage she needs to be seen immediately to which he expressed understanding.  Blood pressure is elevated today.  Patient declined beginning antihypertensive medication.  He was encouraged to avoid NSAIDs, decongestants, caffeine, sodium.  Recommended he monitor his blood pressure at home.  He does not currently have a primary care provider so we will try to establish him with someone via PCP assistance.  Discussed that he should have his blood pressure rechecked within a week.  If he cannot see PCP he should return here.  If he develops any chest pain, shortness of breath, headache, vision change, dizziness he needs to go to the emergency room to which he expressed understanding.  Final Clinical Impressions(s) / UC Diagnoses   Final diagnoses:  Infected cyst of skin  Elevated blood pressure reading     Discharge Instructions       We drained this lesion.  Use warm compresses for comfort and to encourage drainage.  Alternate Tylenol or Profen.  Take Bactrim DS for antibiotic.  If you develop any rash or lesions stop the medication to be seen.  If anything changes return for reevaluation.  Follow-up with ENT.  Your blood pressure is very elevated.  Someone to reach out to you to reschedule a primary care appointment.  Avoid NSAIDs (aspirin, ibuprofen/Advil, naproxen/Aleve) caffeine, sodium, decongestants.  Monitor your blood pressure at home.  If you develop any chest pain, shortness of breath, headache, vision change you need to go to the emergency room.     ED Prescriptions     Medication Sig Dispense Auth. Provider   sulfamethoxazole-trimethoprim (BACTRIM DS) 800-160 MG tablet Take 1 tablet by mouth 2 (two) times daily for 7 days. 14 tablet Alioune Hodgkin, Noberto Retort, PA-C      PDMP not reviewed this encounter.   Jeani Hawking, PA-C 06/19/22 1003

## 2022-06-21 ENCOUNTER — Other Ambulatory Visit: Payer: Self-pay | Admitting: Family Medicine

## 2022-06-21 DIAGNOSIS — L723 Sebaceous cyst: Secondary | ICD-10-CM

## 2022-06-22 ENCOUNTER — Ambulatory Visit
Admission: RE | Admit: 2022-06-22 | Discharge: 2022-06-22 | Disposition: A | Discharge status: Home / Self Care | Payer: Commercial Managed Care - HMO | Source: Ambulatory Visit | Attending: Family Medicine | Admitting: Family Medicine

## 2022-06-22 DIAGNOSIS — L723 Sebaceous cyst: Secondary | ICD-10-CM

## 2022-06-24 ENCOUNTER — Other Ambulatory Visit: Payer: Self-pay | Admitting: Family Medicine

## 2022-06-24 DIAGNOSIS — L989 Disorder of the skin and subcutaneous tissue, unspecified: Secondary | ICD-10-CM

## 2022-07-26 ENCOUNTER — Ambulatory Visit
Admission: RE | Admit: 2022-07-26 | Discharge: 2022-07-26 | Disposition: A | Discharge status: Home / Self Care | Payer: 59 | Source: Ambulatory Visit | Attending: Family Medicine | Admitting: Family Medicine

## 2022-07-26 DIAGNOSIS — L989 Disorder of the skin and subcutaneous tissue, unspecified: Secondary | ICD-10-CM

## 2022-07-26 MED ORDER — IOPAMIDOL (ISOVUE-300) INJECTION 61%
75.0000 mL | Freq: Once | INTRAVENOUS | Status: AC | PRN
  Administered 2022-07-26: 75 mL via INTRAVENOUS

## 2022-12-24 ENCOUNTER — Ambulatory Visit
Admission: RE | Admit: 2022-12-24 | Discharge: 2022-12-24 | Disposition: A | Discharge status: Home / Self Care | Payer: 59 | Source: Ambulatory Visit | Attending: Internal Medicine | Admitting: Internal Medicine

## 2022-12-24 VITALS — BP 148/97 | HR 73 | Temp 97.9°F | Resp 18

## 2022-12-24 DIAGNOSIS — L0291 Cutaneous abscess, unspecified: Secondary | ICD-10-CM

## 2022-12-24 MED ORDER — DOXYCYCLINE HYCLATE 100 MG PO CAPS: 100.0000 mg | ORAL_CAPSULE | Freq: Two times a day (BID) | ORAL | 0 refills | Status: AC

## 2022-12-24 NOTE — ED Triage Notes (Signed)
Pt presents with abscess in left groin area X 3 weeks.

## 2022-12-24 NOTE — Discharge Instructions (Signed)
I have prescribed an antibiotic for abscess.  Use warm compresses.  Follow-up with the ER if symptoms persist or worsen.

## 2022-12-24 NOTE — ED Provider Notes (Signed)
[name]    CSN: [id number] Arrival date & time: 12/24/22  1311      History   Chief Complaint Chief Complaint  Patient presents with   Abscess    HPI Perry Reilly is a 42 y.o. male.   Patient presents with abscess to left groin area that has been present for about 3 weeks.  Patient reports history of abscesses in different areas of the body.  He reports that he did have some drainage over the past few days.  Denies any associated fever.   Abscess   Past Medical History:  Diagnosis Date   Displaced fracture of proximal phalanx of right index finger, initial encounter for closed fracture 03/30/2022   Generalized skin cysts     Patient Active Problem List   Diagnosis Date Noted   Displaced fracture of proximal phalanx of right index finger, initial encounter for closed fracture 03/30/2022   Hidradenitis suppurativa of right axilla 05/29/2013    Past Surgical History:  Procedure Laterality Date   HAND TENDON SURGERY         Home Medications    Prior to Admission medications   Medication Sig Start Date End Date Taking? Authorizing Provider  doxycycline (VIBRAMYCIN) 100 MG capsule Take 1 capsule (100 mg total) by mouth 2 (two) times daily. 12/24/22  Yes Aeden Matranga, Hildred Alamin E, FNP  ibuprofen (ADVIL) 800 MG tablet Take 1 tablet (800 mg total) by mouth 3 (three) times daily. Patient not taking: Reported on 04/01/2022 09/16/21   Hughie Closs, PA-C    Family History Family History  Problem Relation Age of Onset   Hypertension Mother     Social History Social History   Tobacco Use   Smoking status: Some Days   Smokeless tobacco: Never  Substance Use Topics   Alcohol use: Yes    Comment: occasional    Drug use: No     Allergies   Patient has no known allergies.   Review of Systems Review of Systems Per HPI  Physical Exam Triage Vital Signs ED Triage Vitals [12/24/22 1321]  Enc Vitals Group     BP (!) 148/97     Pulse Rate 73      Resp 18     Temp 97.9 F (36.6 C)     Temp Source Oral     SpO2 98 %     Weight      Height      Head Circumference      Peak Flow      Pain Score 5     Pain Loc      Pain Edu?      Excl. in [place]?    No data found.  Updated Vital Signs BP (!) 148/97 (BP Location: Right Arm)   Pulse 73   Temp 97.9 F (36.6 C) (Oral)   Resp 18   SpO2 98%   Visual Acuity Right Eye Distance:   Left Eye Distance:   Bilateral Distance:    Right Eye Near:   Left Eye Near:    Bilateral Near:     Physical Exam Exam conducted with a chaperone present.  Constitutional:      General: He is not in acute distress.    Appearance: Normal appearance. He is not toxic-appearing or diaphoretic.  HENT:     Head: Normocephalic and atraumatic.  Eyes:     Extraocular Movements: Extraocular movements intact.     Conjunctiva/sclera: Conjunctivae normal.  Pulmonary:  Effort: Pulmonary effort is normal.  Skin:         Comments: Approximately 2.5 cm indurated abscess present to left lower suprapubic area directly adjacent to proximal end of penis.  No drainage noted. No scrotal swelling.   Neurological:     General: No focal deficit present.     Mental Status: He is alert and oriented to person, place, and time. Mental status is at baseline.  Psychiatric:        Mood and Affect: Mood normal.        Behavior: Behavior normal.        Thought Content: Thought content normal.        Judgment: Judgment normal.      UC Treatments / Results  Labs (all labs ordered are listed, but only abnormal results are displayed) Labs Reviewed - No data to display  EKG   Radiology No results found.  Procedures Procedures (including critical care time)  Medications Ordered in UC Medications - No data to display  Initial Impression / Assessment and Plan / UC Course  I have reviewed the triage vital signs and the nursing notes.  Pertinent labs & imaging results that were available during my care of the  patient were reviewed by me and considered in my medical decision making (see chart for details).     Patient has abscess so will treat with doxycycline antibiotic. No scrotal involvement which is reassuring.  No drainage necessary at this time. Advised warm compresses. Advised strict follow up. Patient verbalized understanding and was agreeable with plan.  Final Clinical Impressions(s) / UC Diagnoses   Final diagnoses:  Abscess     Discharge Instructions      I have prescribed an antibiotic for abscess.  Use warm compresses.  Follow-up with the ER if symptoms persist or worsen.    ED Prescriptions     Medication Sig Dispense Auth. Provider   doxycycline (VIBRAMYCIN) 100 MG capsule Take 1 capsule (100 mg total) by mouth 2 (two) times daily. 20 capsule Teodora Medici, [place]      PDMP not reviewed this encounter.   Teodora Medici, [place] 12/24/22 [phone]

## 2023-02-17 ENCOUNTER — Ambulatory Visit
Admission: RE | Admit: 2023-02-17 | Discharge: 2023-02-17 | Disposition: A | Discharge status: Home / Self Care | Payer: 59 | Source: Ambulatory Visit | Attending: Nurse Practitioner | Admitting: Nurse Practitioner

## 2023-02-17 VITALS — BP 136/85 | HR 83 | Temp 98.7°F | Resp 19

## 2023-02-17 DIAGNOSIS — L923 Foreign body granuloma of the skin and subcutaneous tissue: Secondary | ICD-10-CM

## 2023-02-17 DIAGNOSIS — Z23 Encounter for immunization: Secondary | ICD-10-CM

## 2023-02-17 MED ORDER — CEPHALEXIN 500 MG PO CAPS: 500.0000 mg | ORAL_CAPSULE | Freq: Two times a day (BID) | ORAL | 0 refills | Status: AC

## 2023-02-17 MED ORDER — TETANUS-DIPHTH-ACELL PERTUSSIS 5-2.5-18.5 LF-MCG/0.5 IM SUSY
0.5000 mL | PREFILLED_SYRINGE | Freq: Once | INTRAMUSCULAR | Status: AC
  Administered 2023-02-17: 0.5 mL via INTRAMUSCULAR

## 2023-02-17 NOTE — ED Provider Notes (Signed)
[name]    CSN: [id number] Arrival date & time: 02/17/23  1329      History   Chief Complaint Chief Complaint  Patient presents with   Leg Problem    HPI Perry Reilly is a 42 y.o. male.   Subjective:   Perry Reilly is a 42 y.o. male who presents today for concerns of an infected tattoo. Patient reports receiving a tattoo to the lateral aspect of the left leg about 4 days ago. The tattoo was done out of someone's home. Patient reports that the person who did the tattoo opened packages of equipment and does not think that the needles were dirty; however, the patient was not satisfied with how the tattoo looked so he went to another person the following day to have it revised.  Patient now complains of swelling of the right lower extremity.  Minimal pain.  No warmth, redness or drainage noted.  No fevers, chills, nausea or vomiting.       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 Authorizing Provider  cephALEXin (KEFLEX) 500 MG capsule Take 1 capsule (500 mg total) by mouth 2 (two) times daily for 7 days. 02/17/23 02/24/23 Yes Lurline Idol, FNP  doxycycline (VIBRAMYCIN) 100 MG capsule Take 1 capsule (100 mg total) by mouth 2 (two) times daily. 12/24/22   Gustavus Bryant, FNP  ibuprofen (ADVIL) 800 MG tablet Take 1 tablet (800 mg total) by mouth 3 (three) times daily. Patient not taking: Reported on 04/01/2022 09/16/21    Rushie Chestnut, PA-C    Family History Family History  Problem Relation Age of Onset   Hypertension Mother     Social History Social History   Tobacco Use   Smoking status: Some Days   Smokeless tobacco: Never  Substance Use Topics   Alcohol use: Yes    Comment: occasional    Drug use: No     Allergies   Patient has no known allergies.   Review of Systems Review of Systems  Constitutional:  Negative for chills and fever.  Skin:  Positive for wound.     Physical Exam Triage Vital Signs ED Triage Vitals [02/17/23 1401]  Enc Vitals Group     BP 136/85     Pulse Rate 83     Resp 19     Temp 98.7 F (37.1 C)     Temp Source Oral     SpO2 98 %     Weight      Height      Head Circumference      Peak Flow      Pain Score 0     Pain Loc      Pain Edu?      Excl. in GC?    No data found.  Updated Vital Signs BP 136/85 (BP Location: Left Arm)   Pulse 83   Temp 98.7 F (  37.1 C) (Oral)   Resp 19   SpO2 98%   Visual Acuity Right Eye Distance:   Left Eye Distance:   Bilateral Distance:    Right Eye Near:   Left Eye Near:    Bilateral Near:     Physical Exam Vitals reviewed.  Constitutional:      Appearance: Normal appearance.  Cardiovascular:     Rate and Rhythm: Normal rate.  Pulmonary:     Effort: Pulmonary effort is normal.  Musculoskeletal:        General: Normal range of motion.     Cervical back: Normal range of motion and neck supple.  Skin:    General: Skin is warm and dry.       Neurological:     General: No focal deficit present.     Mental Status: He is alert and oriented to person, place, and time.      UC Treatments / Results  Labs (all labs ordered are listed, but only abnormal results are displayed) Labs Reviewed - No data to display  EKG   Radiology No results found.  Procedures Procedures (including critical care time)  Medications Ordered in UC Medications  Tdap (BOOSTRIX) injection 0.5 mL (has no  administration in time range)    Initial Impression / Assessment and Plan / UC Course  I have reviewed the triage vital signs and the nursing notes.  Pertinent labs & imaging results that were available during my care of the patient were reviewed by me and considered in my medical decision making (see chart for details).    42 yo male presenting with concerns of possible tattoo infection.  Patient is afebrile.  Nontoxic.  There is some localized swelling noted but does not seem to be clinically significant and somewhat expected after getting a tattoo.  No warmth, redness or drainage noted.  No indication of acute infection; however, that his tattoo was done inside someone's home, will cover him with a 7-day course of Keflex to hopefully prevent any infection.  Tetanus was also updated. Discussed appropriate home care of this wound as well as indications for follow-up.  Today's evaluation has revealed no signs of a dangerous process. Discussed diagnosis with patient and/or guardian. Patient and/or guardian aware of their diagnosis, possible red flag symptoms to watch out for and need for close follow up. Patient and/or guardian understands verbal and written discharge instructions. Patient and/or guardian comfortable with plan and disposition.  Patient and/or guardian has a clear mental status at this time, good insight into illness (after discussion and teaching) and has clear judgment to make decisions regarding their care  Documentation was completed with the aid of voice recognition software. Transcription may contain typographical errors. Final Clinical Impressions(s) / UC Diagnoses   Final diagnoses:  Tattoo reaction     Discharge Instructions      You have been seen today for your concern of possible infection of the tattoo that you recently got.  Your tattoo does not look infected at this time; however, most tattoo infections occur within the first 3 weeks of getting the tattoo.   Considering that you got your tattoo from someone who did it in their home, I am going to cover you with antibiotics to hopefully prevent the development of any infection.  I have also updated your tetanus.   Do not scratch or pick at your tattoo  Do not take baths, swim, or use a hot tub until your tattoo completely heals. Showers are ok.  Monitor your tattoo daily.   Follow-up here or go to the ED if you began to develop:  Redness, swelling, or pain to tattoo/leg Fluid or blood coming from the tattoo Warmth of the leg or tattoo  A fever or chills You are nauseous or you vomit. You are dizzy. You have a new rash or hardness around the tattoo     ED Prescriptions     Medication Sig Dispense Auth. Provider   cephALEXin (KEFLEX) 500 MG capsule Take 1 capsule (500 mg total) by mouth 2 (two) times daily for 7 days. 14 capsule Lurline IdolMurrill, Doris Mcgilvery, FNP      PDMP not reviewed this encounter.   Lurline IdolMurrill, Emmitte Surgeon, [place]NP 02/17/23 1523

## 2023-02-17 NOTE — ED Triage Notes (Signed)
Pt presents with c/o left leg swelling and redness. States he went to get a tattoo and states he had two different people work on the same tattoo. Denies pain right now. States he had some pain 2 days ago.

## 2023-02-17 NOTE — Discharge Instructions (Addendum)
You have been seen today for your concern of possible infection of the tattoo that you recently got.  Your tattoo does not look infected at this time; however, most tattoo infections occur within the first 3 weeks of getting the tattoo.  Considering that you got your tattoo from someone who did it in their home, I am going to cover you with antibiotics to hopefully prevent the development of any infection.  I have also updated your tetanus.   Do not scratch or pick at your tattoo  Do not take baths, swim, or use a hot tub until your tattoo completely heals. Showers are ok.   Monitor your tattoo daily.   Follow-up here or go to the ED if you began to develop:  Redness, swelling, or pain to tattoo/leg Fluid or blood coming from the tattoo Warmth of the leg or tattoo  A fever or chills You are nauseous or you vomit. You are dizzy. You have a new rash or hardness around the tattoo

## 2023-02-24 ENCOUNTER — Ambulatory Visit: Payer: Self-pay

## 2023-02-25 ENCOUNTER — Emergency Department (HOSPITAL_BASED_OUTPATIENT_CLINIC_OR_DEPARTMENT_OTHER)
Admission: EM | Admit: 2023-02-25 | Discharge: 2023-02-25 | Disposition: A | Discharge status: Home / Self Care | Payer: 59 | Attending: Emergency Medicine | Admitting: Emergency Medicine

## 2023-02-25 ENCOUNTER — Other Ambulatory Visit: Payer: Self-pay

## 2023-02-25 ENCOUNTER — Encounter (HOSPITAL_BASED_OUTPATIENT_CLINIC_OR_DEPARTMENT_OTHER): Payer: Self-pay

## 2023-02-25 DIAGNOSIS — L02411 Cutaneous abscess of right axilla: Secondary | ICD-10-CM | POA: Diagnosis not present

## 2023-02-25 DIAGNOSIS — I10 Essential (primary) hypertension: Secondary | ICD-10-CM | POA: Insufficient documentation

## 2023-02-25 MED ORDER — OXYCODONE-ACETAMINOPHEN 5-325 MG PO TABS
1.0000 | ORAL_TABLET | Freq: Once | ORAL | Status: AC
  Administered 2023-02-25: 1 via ORAL
  Filled 2023-02-25: qty 1

## 2023-02-25 MED ORDER — IBUPROFEN 800 MG PO TABS
800.0000 mg | ORAL_TABLET | Freq: Once | ORAL | Status: AC
  Administered 2023-02-25: 800 mg via ORAL
  Filled 2023-02-25: qty 1

## 2023-02-25 MED ORDER — LIDOCAINE-EPINEPHRINE-TETRACAINE (LET) TOPICAL GEL
3.0000 mL | Freq: Once | TOPICAL | Status: AC
  Administered 2023-02-25: 3 mL via TOPICAL
  Filled 2023-02-25: qty 3

## 2023-02-25 MED ORDER — DOXYCYCLINE HYCLATE 100 MG PO TABS: 100.0000 mg | ORAL_TABLET | Freq: Once | ORAL | Status: DC

## 2023-02-25 MED ORDER — DOXYCYCLINE HYCLATE 100 MG PO CAPS: 100.0000 mg | ORAL_CAPSULE | Freq: Two times a day (BID) | ORAL | 0 refills | Status: AC

## 2023-02-25 MED ORDER — IBUPROFEN 600 MG PO TABS: 600.0000 mg | ORAL_TABLET | Freq: Four times a day (QID) | ORAL | 0 refills | Status: AC | PRN

## 2023-02-25 NOTE — Discharge Instructions (Addendum)
Your wound will likely continue to drain and bleed a little bit for the next 1 to 2 days.  You can shower normally.  Afterwards pat the wound dry and keep it covered for the next 2 days.  This should continue draining.  You can apply warm compresses as needed under your armpit.  I also recommend that you take ibuprofen as prescribed to help with the pain and swelling of your lymph nodes under your armpit.  I started you on an antibiotic to prevent an infection.

## 2023-02-25 NOTE — ED Triage Notes (Signed)
Patient has an abscess under left axilla for 4 days. He has had a I&D for same.

## 2023-02-25 NOTE — ED Provider Notes (Signed)
[place] [facility] Provider Note   CSN: [id number] Arrival date & time: 02/25/23  0703     History  Chief Complaint  Patient presents with   Abscess    Perry Reilly is a 42 y.o. male presenting to ED with concern for painful lump under right armpit.  Reports hx of recurring abscesses in right axilla, most recently "about 3-4 years ago in this spot."  Pain for 3-4 days.  No fevers/chills.  HPI     Home Medications Prior to Admission medications   Medication Sig Start Date End Date Taking? Authorizing Provider  doxycycline (VIBRAMYCIN) 100 MG capsule Take 1 capsule (100 mg total) by mouth 2 (two) times daily for 7 days. 02/25/23 03/04/23 Yes Drako Maese, Kermit Balo, MD  ibuprofen (ADVIL) 600 MG tablet Take 1 tablet (600 mg total) by mouth every 6 (six) hours as needed for up to 30 doses for moderate pain or mild pain. 02/25/23  Yes Tamyra Fojtik, Kermit Balo, MD  doxycycline (VIBRAMYCIN) 100 MG capsule Take 1 capsule (100 mg total) by mouth 2 (two) times daily. 12/24/22   Gustavus Bryant, FNP  ibuprofen (ADVIL) 800 MG tablet Take 1 tablet (800 mg total) by mouth 3 (three) times daily. Patient not taking: Reported on 04/01/2022 09/16/21   Rushie Chestnut, PA-C      Allergies    Patient has no known allergies.    Review of Systems   Review of Systems  Physical Exam Updated Vital Signs BP (!) 165/95 (BP Location: Left Arm)   Pulse 64   Temp 97.7 F (36.5 C) (Oral)   Resp 16   Ht 6' (1.829 m)   Wt 68 kg   SpO2 100%   BMI 20.34 kg/m  Physical Exam Constitutional:      General: He is not in acute distress. HENT:     Head: Normocephalic and atraumatic.  Eyes:     Conjunctiva/sclera: Conjunctivae normal.     Pupils: Pupils are equal, round, and reactive to light.  Cardiovascular:     Rate and Rhythm: Normal rate and regular rhythm.  Pulmonary:     Effort: Pulmonary effort is normal. No respiratory distress.  Skin:    General: Skin is warm and  dry.     Comments: Large fluctuant, tender mass noted in right axilla  Neurological:     General: No focal deficit present.     Mental Status: He is alert. Mental status is at baseline.  Psychiatric:        Mood and Affect: Mood normal.        Behavior: Behavior normal.     ED Results / Procedures / Treatments   Labs (all labs ordered are listed, but only abnormal results are displayed) Labs Reviewed - No data to display  EKG None  Radiology No results found.  Procedures .Marland KitchenIncision and Drainage  Date/Time: 02/25/2023 7:59 AM  Performed by: Terald Sleeper, MD Authorized by: Terald Sleeper, MD   Consent:    Consent obtained:  Verbal   Consent given by:  Patient   Risks, benefits, and alternatives were discussed: yes     Risks discussed:  Bleeding, incomplete drainage, infection, pain and damage to other organs Universal protocol:    Procedure explained and questions answered to patient or proxy's satisfaction: yes     Site/side marked: yes     Immediately prior to procedure, a time out was called: yes     Patient identity  confirmed:  Arm band Location:    Type:  Abscess   Size:  2.5 cm   Location:  Upper extremity   Upper extremity location:  Shoulder   Shoulder location:  R shoulder (axilla) Pre-procedure details:    Skin preparation:  Povidone-iodine Sedation:    Sedation type:  Anxiolysis Anesthesia:    Anesthesia method:  Topical application   Topical anesthetic:  LET Procedure type:    Complexity:  Simple Procedure details:    Ultrasound guidance: yes     Needle aspiration: no     Incision types:  Single straight   Wound management:  Probed and deloculated   Drainage:  Purulent   Drainage amount:  Moderate   Wound treatment:  Wound left open   Packing materials:  None Post-procedure details:    Procedure completion:  Tolerated well, no immediate complications     Medications Ordered in ED Medications  doxycycline (VIBRA-TABS) tablet 100 mg  (has no administration in time range)  oxyCODONE-acetaminophen (PERCOCET/ROXICET) 5-325 MG per tablet 1 tablet (1 tablet Oral Given 02/25/23 0728)  ibuprofen (ADVIL) tablet 800 mg (800 mg Oral Given 02/25/23 0728)  lidocaine-EPINEPHrine-tetracaine (LET) topical gel (3 mLs Topical Given 02/25/23 1610)    ED Course/ Medical Decision Making/ A&P Clinical Course as of 02/25/23 0801  Fri Feb 25, 2023  0726 Patient made aware BP is elevated, which may be attributable to pain and discomfort, but I recommended outpatient monitoring of BP. [MT]    Clinical Course User Index [MT] Terald Sleeper, MD                             Medical Decision Making Risk Prescription drug management.   Pt here with suspected axilla abscess.  On bedside ultrasound abscess showing infectious pocket about 2.5 cm in depth, by 2.5 cm width.  Pt consented for I&D.  LET and pain medication given prior.  See procedural note.  Significant surrounding lymphadenopathy - advised motrin for several days  Also doxycycline for infection/MRSA prophylaxis given significant degree of regional lymphadenopathy and history of recurring abscess  Doubt sepsis, septic joint, bacteremia at this time  Okay for discharge at this time        Final Clinical Impression(s) / ED Diagnoses Final diagnoses:  Abscess of right axilla  Hypertension, unspecified type    Rx / DC Orders ED Discharge Orders          Ordered    doxycycline (VIBRAMYCIN) 100 MG capsule  2 times daily        02/25/23 0758    ibuprofen (ADVIL) 600 MG tablet  Every 6 hours PRN        02/25/23 0758              Terald Sleeper, MD 02/25/23 [id number]

## 2023-03-22 DIAGNOSIS — L728 Other follicular cysts of the skin and subcutaneous tissue: Secondary | ICD-10-CM | POA: Diagnosis not present

## 2023-03-22 DIAGNOSIS — L732 Hidradenitis suppurativa: Secondary | ICD-10-CM | POA: Diagnosis not present

## 2023-03-22 DIAGNOSIS — L700 Acne vulgaris: Secondary | ICD-10-CM | POA: Diagnosis not present

## 2023-04-27 IMAGING — CT CT NECK W/ CM
4 of 5 series · 14 of 35 positions shown, 16 images · IV contrast (agent unspecified)
Comparison: Head and face CT 03/10/2008.

CLINICAL DATA: 40-year-old male with left side waxing and waning
neck mass near the ear discovered 4 months ago.

EXAM:
CT NECK WITH CONTRAST
TECHNIQUE: Multidetector CT imaging of the neck was performed using the
standard protocol following the bolus administration of intravenous
contrast.

[Series 3: axial bone neck 2.00 · axial · 0.52mm/px · z∈[-729,-601]mm · 3 of 129 slices shown, 4 images]
[im 33/129  soft-tissue]
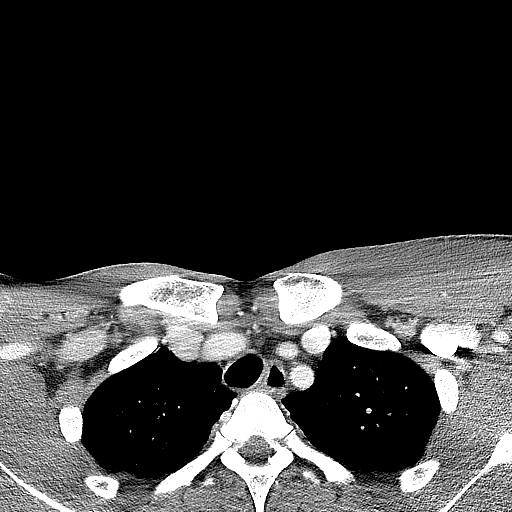
[im 33/129  bone]
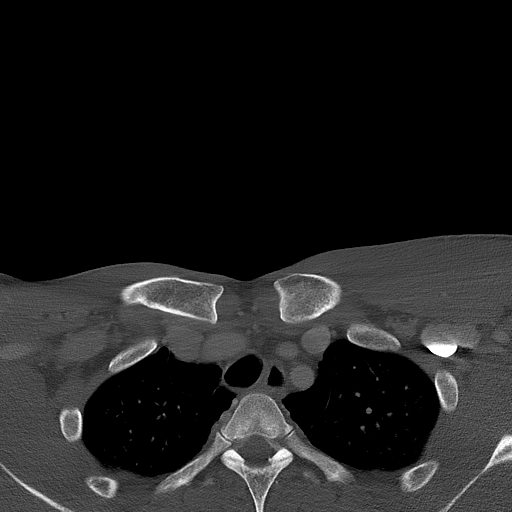
[im 65/129  bone]
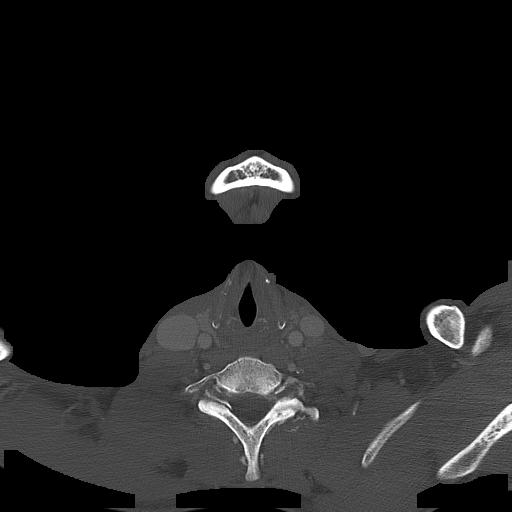
[im 97/129  bone]
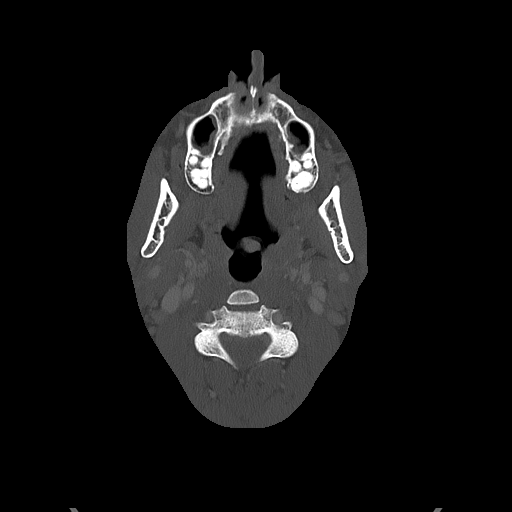

[Series 4: coronal neck neck (person_name) 2.00 cor · coronal · 0.51mm/px · 3 of 126 slices shown]
[im 26/126  bone]
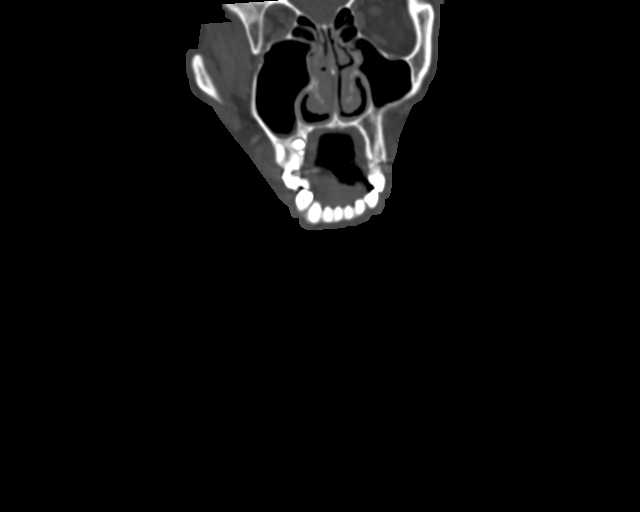
[im 51/126  bone]
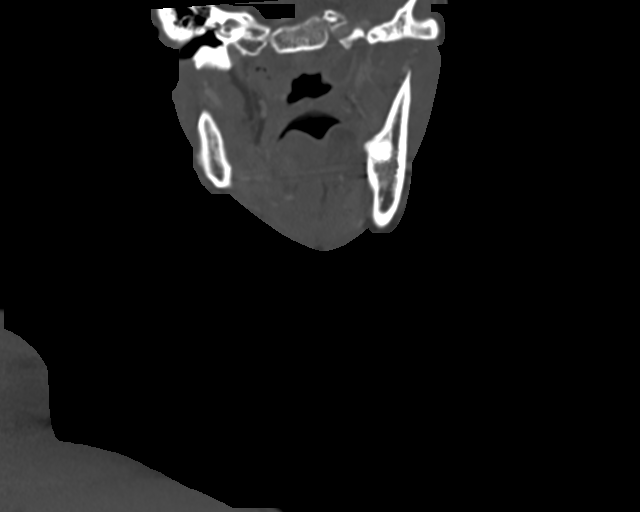
[im 76/126  bone]
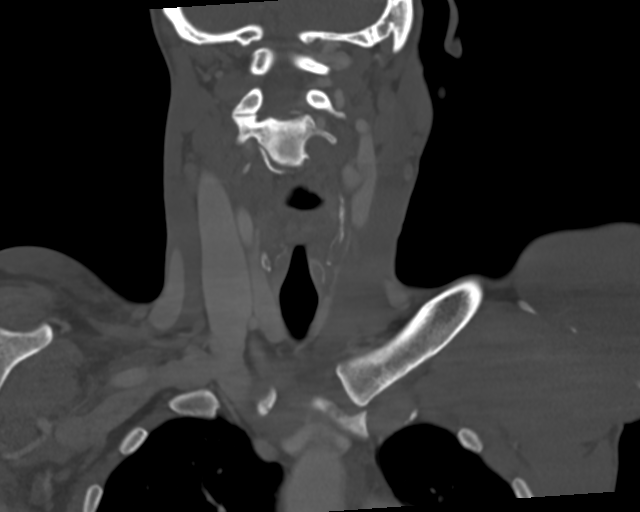

[Series 6: sagittal neck neck (person_name) 2.00 sag · sagittal · 0.50mm/px · 5 of 161 slices shown, 6 images]
[im 54/161  bone]
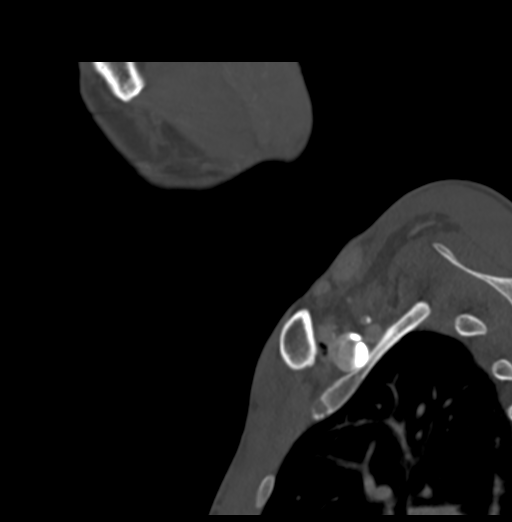
[im 67/161  bone]
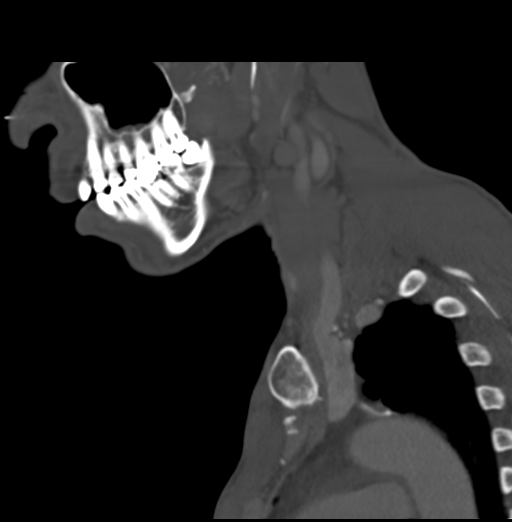
[im 81/161  soft-tissue]
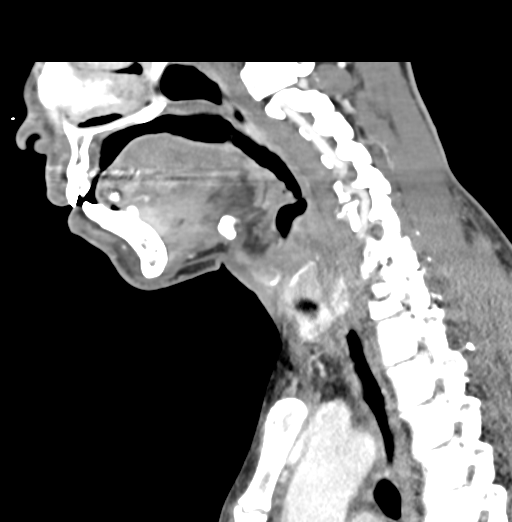
[im 81/161  bone]
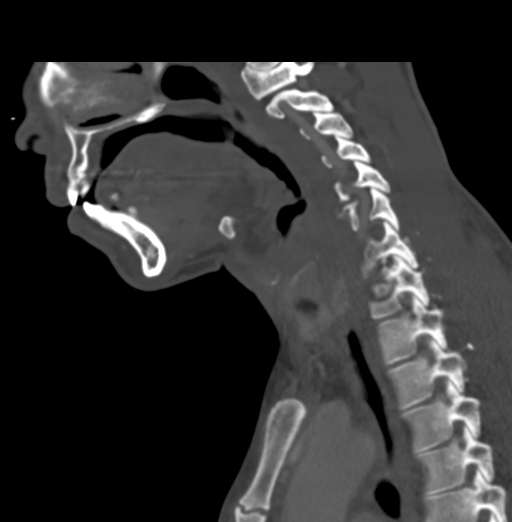
[im 94/161  bone]
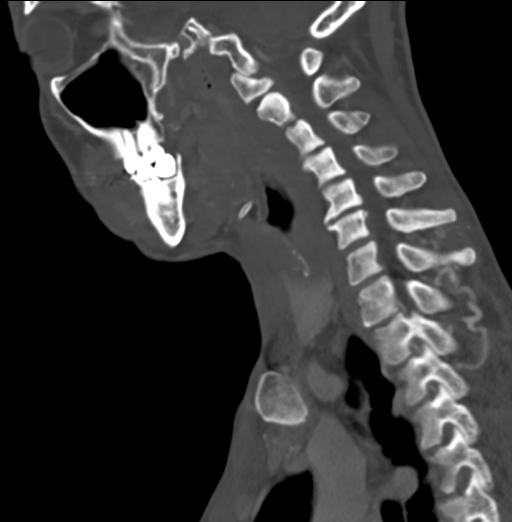
[im 107/161  bone]
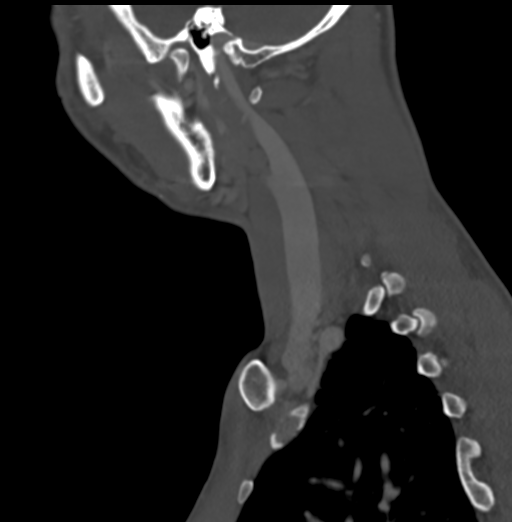

[Series 8: ax oropharynx neck neck (person_name) 2.00 ax · axial · 0.50mm/px · z∈[-719,-590]mm · 3 of 128 slices shown]
[im 32/128  bone]
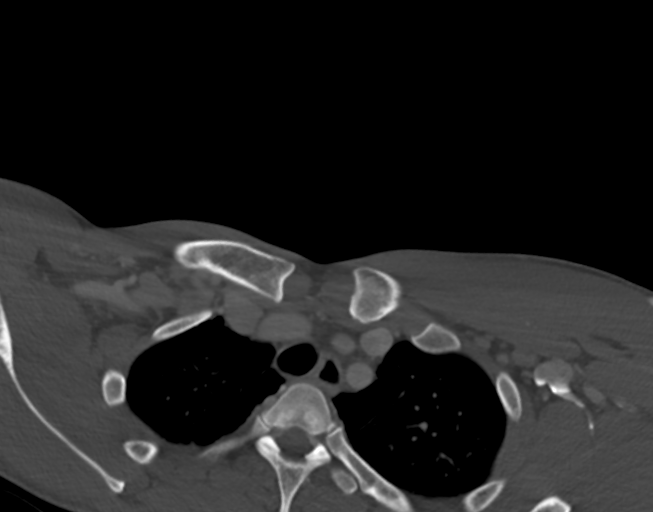
[im 64/128  bone]
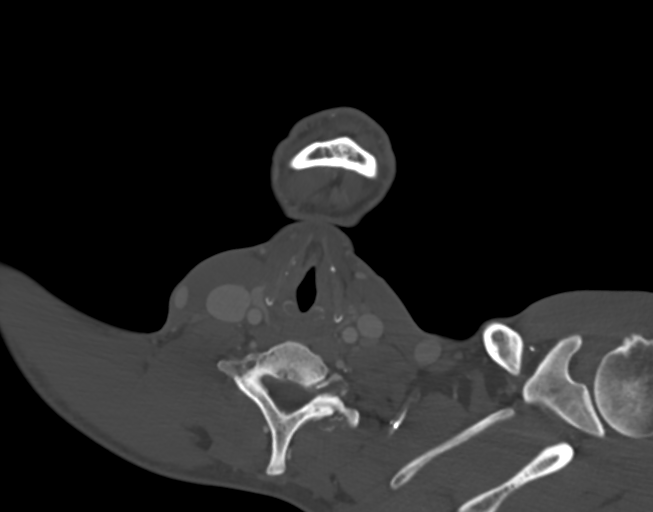
[im 96/128  bone]
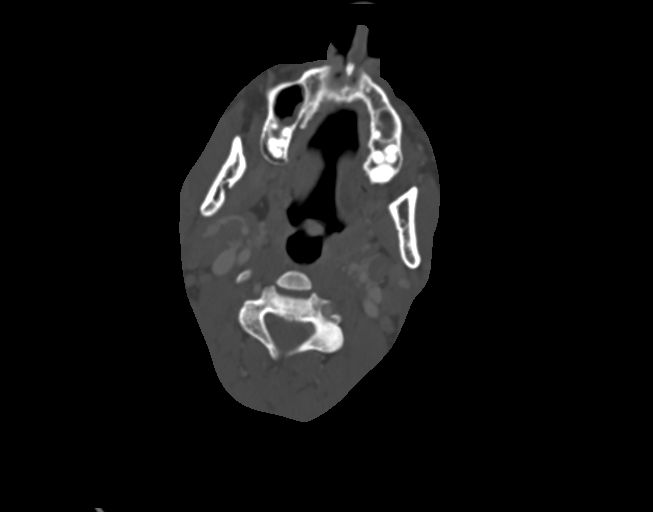

[14 of 35 positions shown; findings below may reference images not displayed]

RADIATION DOSE REDUCTION: This exam was performed according to the
departmental dose-optimization program which includes automated
exposure control, adjustment of the mA and/or kV according to
patient size and/or use of iterative reconstruction technique.

CONTRAST:  75mL OMNIPAQUE IOHEXOL 300 MG/ML  SOLN
FINDINGS: Pharynx and larynx: Larynx and pharynx soft tissue contours are
within normal limits. No suspicious hyperenhancement. Parapharyngeal
and retropharyngeal spaces appear normal.

Salivary glands: Negative sublingual space. Atrophied or absent left
submandibular gland, stable since 4005. Right submandibular gland
within normal limits.

Bilateral parotid glands appear symmetric and within normal limits.
Overlying the left parotid is a marked area of clinical concern on
coronal image 74 and series 2, image 26. No underlying parotid mass
or abnormality identified. No asymmetric parotid hyperenhancement.
Bilateral stylomastoid foramina appear symmetric and within normal
limits. Nearby [employer] appears symmetric and within normal limits.

Thyroid: Negative.

Lymph nodes: Bilateral cervical lymph nodes appear within normal
limits, largest at the bilateral level 2 stations up to 10 mm short
axis. No cystic or necrotic nodes.

Vascular: Major vascular structures in the neck and at the skull
base are patent. There is mild right carotid bifurcation
atherosclerosis. Right IJ is dominant.

Limited intracranial: Negative.

Visualized orbits: Negative.

Mastoids and visualized paranasal sinuses: Trace bilateral paranasal
sinus mucosal thickening. Visible tympanic cavities and mastoids are
clear. No sinus fluid level.

Skeleton: Transverse probably supernumerary right maxillary tooth
(series 3, image 37). No acute dental finding identified. No acute
osseous abnormality identified. Mild cervical spine disc and
endplate degeneration.

Upper chest: Apical paraseptal emphysema. Otherwise negative visible
upper lungs. Negative visible mediastinum.
IMPRESSION: 1. Marked area of clinical concern corresponds to the left parotid
space. No parotid mass or abnormality identified by CT. If there is
continued clinical suspicion a Face MRI (without and with contrast
preferred) or Parotid Ultrasound would evaluate the gland parenchyma
with the highest sensitivity.

2. Atrophied or absent left submandibular gland, stable since 4005.
Otherwise negative Neck CT.

3. Emphysema ([id number]).

## 2023-05-15 ENCOUNTER — Encounter (HOSPITAL_BASED_OUTPATIENT_CLINIC_OR_DEPARTMENT_OTHER): Payer: Self-pay

## 2023-05-15 ENCOUNTER — Emergency Department (HOSPITAL_BASED_OUTPATIENT_CLINIC_OR_DEPARTMENT_OTHER)
Admission: EM | Admit: 2023-05-15 | Discharge: 2023-05-15 | Disposition: A | Discharge status: Home / Self Care | Payer: 59 | Attending: Emergency Medicine | Admitting: Emergency Medicine

## 2023-05-15 DIAGNOSIS — L0291 Cutaneous abscess, unspecified: Secondary | ICD-10-CM | POA: Insufficient documentation

## 2023-05-15 DIAGNOSIS — L0201 Cutaneous abscess of face: Secondary | ICD-10-CM | POA: Diagnosis not present

## 2023-05-15 MED ORDER — DOXYCYCLINE HYCLATE 100 MG PO CAPS: 100.0000 mg | ORAL_CAPSULE | Freq: Two times a day (BID) | ORAL | 0 refills | Status: AC

## 2023-05-15 MED ORDER — LIDOCAINE-EPINEPHRINE (PF) 2 %-1:200000 IJ SOLN
10.0000 mL | Freq: Once | INTRAMUSCULAR | Status: AC
  Administered 2023-05-15: 10 mL
  Filled 2023-05-15: qty 20

## 2023-05-15 MED ORDER — TRAMADOL HCL 50 MG PO TABS: 50.0000 mg | ORAL_TABLET | Freq: Four times a day (QID) | ORAL | 0 refills | Status: AC | PRN

## 2023-05-15 NOTE — ED Provider Notes (Signed)
[place] [facility] AT [facility] Provider Note   CSN: [id number] Arrival date & time: 05/15/23  4098     History {Add pertinent medical, surgical, social history, OB history to HPI:1} Chief Complaint  Patient presents with   Otalgia    Perry Reilly is a 42 y.o. male who presents with a cc of abscess. He has a hx of prebious episode of inflammation a few years ago tx w/ abx. He reports being uninsured at the time and could not follow with [facility]. Patient developed some pain and swelling inferior and posterior to the L ear 3 days ago, progressively worsening and now very painful. Denies fevers.    Otalgia      Home Medications Prior to Admission medications   Medication Sig Start Date End Date Taking? Authorizing Provider  doxycycline (VIBRAMYCIN) 100 MG capsule Take 1 capsule (100 mg total) by mouth 2 (two) times daily. 12/24/22   Gustavus Bryant, FNP  ibuprofen (ADVIL) 600 MG tablet Take 1 tablet (600 mg total) by mouth every 6 (six) hours as needed for up to 30 doses for moderate pain or mild pain. 02/25/23   Terald Sleeper, MD  ibuprofen (ADVIL) 800 MG tablet Take 1 tablet (800 mg total) by mouth 3 (three) times daily. Patient not taking: Reported on 04/01/2022 09/16/21   Rushie Chestnut, PA-C      Allergies    Patient has no known allergies.    Review of Systems   Review of Systems  HENT:  Positive for ear pain.     Physical Exam Updated Vital Signs BP (!) 149/63 (BP Location: Right Arm)   Pulse 70   Temp 98.3 F (36.8 C) (Oral)   Resp 16   Ht 6' (1.829 m)   Wt 68 kg   SpO2 99%   BMI 20.34 kg/m  Physical Exam Vitals and nursing note reviewed.  Constitutional:      General: He is not in acute distress.    Appearance: He is well-developed. He is not diaphoretic.  HENT:     Head: Normocephalic and atraumatic.   Eyes:     General: No scleral icterus.    Conjunctiva/sclera: Conjunctivae normal.  Cardiovascular:     Rate and Rhythm:  Normal rate and regular rhythm.     Heart sounds: Normal heart sounds.  Pulmonary:     Effort: Pulmonary effort is normal. No respiratory distress.     Breath sounds: Normal breath sounds.  Abdominal:     Palpations: Abdomen is soft.     Tenderness: There is no abdominal tenderness.  Musculoskeletal:     Cervical back: Normal range of motion and neck supple.  Skin:    General: Skin is warm and dry.  Neurological:     Mental Status: He is alert.  Psychiatric:        Behavior: Behavior normal.     ED Results / Procedures / Treatments   Labs (all labs ordered are listed, but only abnormal results are displayed) Labs Reviewed - No data to display  EKG None  Radiology No results found.  Procedures Procedures  {Document cardiac monitor, telemetry assessment procedure when appropriate:1}  Medications Ordered in ED Medications  lidocaine-EPINEPHrine (XYLOCAINE W/EPI) 2 %-1:200000 (PF) injection 10 mL (has no administration in time range)    ED Course/ Medical Decision Making/ A&P   {   Click here for ABCD2, HEART and other calculatorsREFRESH Note before signing :1}  Medical Decision Making Risk Prescription drug management.   ***  {Document critical care time when appropriate:1} {Document review of labs and clinical decision tools ie heart score, Chads2Vasc2 etc:1}  {Document your independent review of radiology images, and any outside records:1} {Document your discussion with family members, caretakers, and with consultants:1} {Document social determinants of health affecting pt's care:1} {Document your decision making why or why not admission, treatments were needed:1} Final Clinical Impression(s) / ED Diagnoses Final diagnoses:  None    Rx / DC Orders ED Discharge Orders     None

## 2023-05-15 NOTE — ED Notes (Signed)
Discharge paperwork given and verbally understood. 

## 2023-05-15 NOTE — ED Triage Notes (Signed)
Pt c/o abscess by left ear. First noticed 3 days ago. Pt states he tried warm compresses last night without relief.

## 2023-05-15 NOTE — Discharge Instructions (Signed)
Contact a health care provider if: You see redness that spreads quickly or red streaks on your skin spreading away from the abscess. You have any signs of worse infection at the abscess. You vomit every time you eat or drink. You have a fever, chills, or muscle aches. The cyst or abscess returns. Get help right away if: You have severe pain. You make less pee (urine) than normal. Contact a health care provider if: You have a fever or chills. You have new symptoms. Your symptoms get worse. Your symptoms do not improve with treatment. Get help right away if: You have difficulty breathing or swallowing because of the swollen gland. These symptoms may represent a serious problem that is an emergency. Do not wait to see if the symptoms will go away. Get medical help right away. Call your local emergency services (911 in the [place] Do not drive yourself to the hospital.

## 2023-05-19 DIAGNOSIS — L729 Follicular cyst of the skin and subcutaneous tissue, unspecified: Secondary | ICD-10-CM | POA: Diagnosis not present

## 2023-05-19 DIAGNOSIS — L089 Local infection of the skin and subcutaneous tissue, unspecified: Secondary | ICD-10-CM | POA: Diagnosis not present

## 2023-05-31 ENCOUNTER — Telehealth: Payer: Self-pay | Admitting: Otolaryngology

## 2023-05-31 NOTE — Telephone Encounter (Signed)
Called pt to let him know we would have to cancel apt. He has already seen ENT Dr Jenne Pane and also has a follow up apt with Dr bates on 06-02-23

## 2023-06-02 ENCOUNTER — Institutional Professional Consult (permissible substitution) (INDEPENDENT_AMBULATORY_CARE_PROVIDER_SITE_OTHER): Payer: 59 | Admitting: Otolaryngology

## 2023-06-22 DIAGNOSIS — L729 Follicular cyst of the skin and subcutaneous tissue, unspecified: Secondary | ICD-10-CM | POA: Diagnosis not present

## 2023-06-22 DIAGNOSIS — L089 Local infection of the skin and subcutaneous tissue, unspecified: Secondary | ICD-10-CM | POA: Diagnosis not present

## 2023-07-25 ENCOUNTER — Other Ambulatory Visit: Payer: Self-pay | Admitting: Otolaryngology

## 2023-07-25 DIAGNOSIS — L723 Sebaceous cyst: Secondary | ICD-10-CM | POA: Diagnosis not present

## 2023-07-28 LAB — DERMATOLOGY PATHOLOGY

## 2023-08-08 DIAGNOSIS — L089 Local infection of the skin and subcutaneous tissue, unspecified: Secondary | ICD-10-CM | POA: Diagnosis not present

## 2023-08-08 DIAGNOSIS — L729 Follicular cyst of the skin and subcutaneous tissue, unspecified: Secondary | ICD-10-CM | POA: Diagnosis not present
